# Patient Record
Sex: Female | Born: 1935 | Race: White | Hispanic: No | Smoking: Never smoker
Health system: Southern US, Community
[De-identification: ages and names within clinical notes are randomized; demographics above are authoritative.]

## PROBLEM LIST (undated history)

## (undated) DIAGNOSIS — F09 Unspecified mental disorder due to known physiological condition: Secondary | ICD-10-CM

## (undated) DIAGNOSIS — I10 Essential (primary) hypertension: Secondary | ICD-10-CM

## (undated) DIAGNOSIS — J449 Chronic obstructive pulmonary disease, unspecified: Secondary | ICD-10-CM

## (undated) DIAGNOSIS — E039 Hypothyroidism, unspecified: Secondary | ICD-10-CM

## (undated) DIAGNOSIS — G4700 Insomnia, unspecified: Secondary | ICD-10-CM

## (undated) DIAGNOSIS — R609 Edema, unspecified: Secondary | ICD-10-CM

---

## 2000-02-04 ENCOUNTER — Other Ambulatory Visit: Admission: RE | Admit: 2000-02-04 | Discharge: 2000-02-04 | Payer: Self-pay | Admitting: Family Medicine

## 2000-02-14 ENCOUNTER — Encounter: Payer: Self-pay | Admitting: Family Medicine

## 2000-02-14 ENCOUNTER — Encounter: Admission: RE | Admit: 2000-02-14 | Discharge: 2000-02-14 | Payer: Self-pay | Admitting: Family Medicine

## 2000-12-05 ENCOUNTER — Encounter: Payer: Self-pay | Admitting: Emergency Medicine

## 2000-12-05 ENCOUNTER — Inpatient Hospital Stay (HOSPITAL_COMMUNITY): Admission: EM | Admit: 2000-12-05 | Discharge: 2000-12-06 | Payer: Self-pay | Admitting: Emergency Medicine

## 2001-01-20 ENCOUNTER — Other Ambulatory Visit: Admission: RE | Admit: 2001-01-20 | Discharge: 2001-01-20 | Payer: Self-pay | Admitting: Family Medicine

## 2002-02-11 ENCOUNTER — Other Ambulatory Visit: Admission: RE | Admit: 2002-02-11 | Discharge: 2002-02-11 | Payer: Self-pay | Admitting: Family Medicine

## 2004-01-18 ENCOUNTER — Encounter: Admission: RE | Admit: 2004-01-18 | Discharge: 2004-01-18 | Payer: Self-pay | Admitting: Family Medicine

## 2005-03-14 ENCOUNTER — Other Ambulatory Visit: Admission: RE | Admit: 2005-03-14 | Discharge: 2005-03-14 | Payer: Self-pay | Admitting: Family Medicine

## 2005-08-24 IMAGING — CR DG CHEST 2V
2 series · 2 of 2 positions shown · non-contrast
Comparison: none

CLINICAL DATA: Cough.  
CHEST ? TWO VIEWS
Two views of the chest are compared to a chest x-ray of 02/14/00.  The lungs remain hyperaerated consistent with COPD.  Particularly on the lateral view, there are slightly more prominent markings posteriorly at the lung base which may be medially at the right lung base.  This could represent atelectasis or pneumonia, and a follow-up chest x-ray is recommended.  Mild cardiomegaly is stable.  No acute bony abnormality is seen. 
IMPRESSION
1.  Slightly prominent markings at the lung base posteriorly on the lateral view probably medially at the right lung base, which could represent pneumonia.  Suggest follow-up chest x-ray. 
2.  COPD.

[view not recorded (1 of 2)]
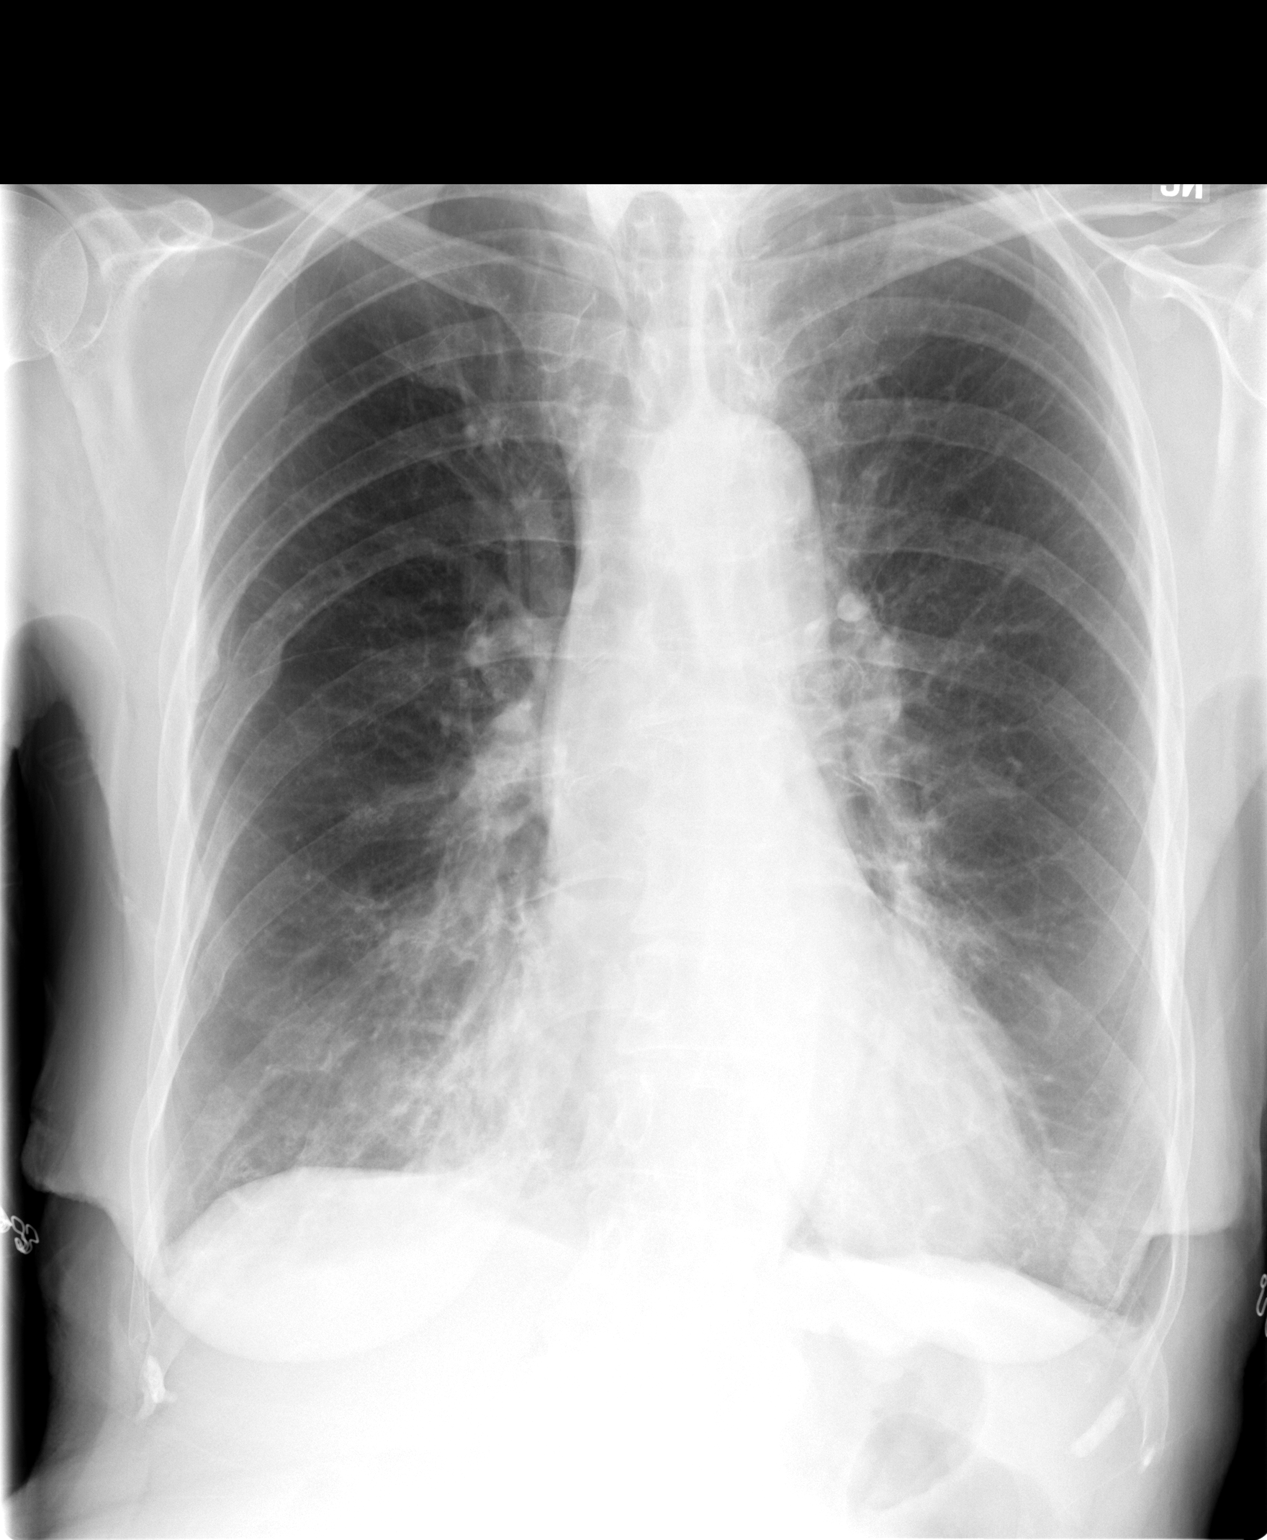

[view not recorded (2 of 2)]
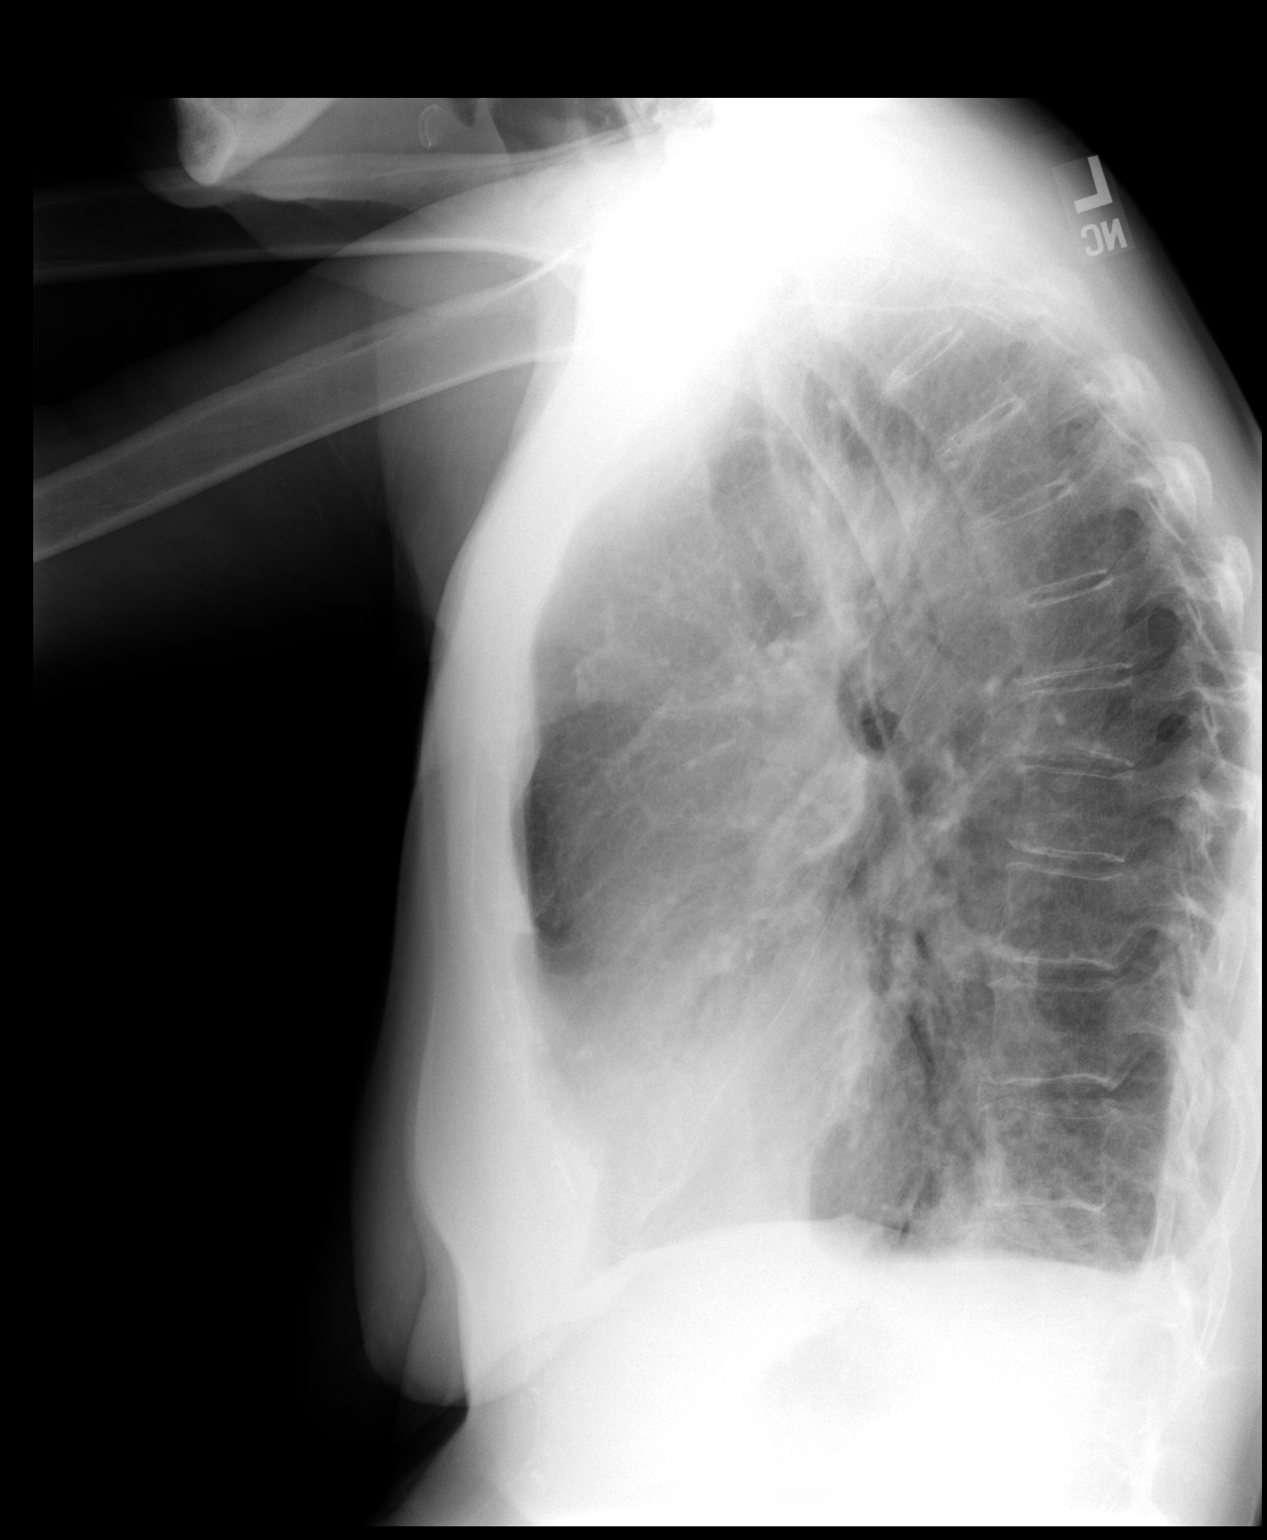

[2 of 2 positions shown; findings below may reference images not displayed]

## 2009-09-23 ENCOUNTER — Emergency Department (HOSPITAL_COMMUNITY): Admission: EM | Admit: 2009-09-23 | Discharge: 2009-09-23 | Payer: Self-pay | Admitting: Emergency Medicine

## 2010-10-08 LAB — CBC
HCT: 36.3 % (ref 36.0–46.0)
Hemoglobin: 11.7 g/dL — ABNORMAL LOW (ref 12.0–15.0)
MCHC: 32.3 g/dL (ref 30.0–36.0)
MCV: 95.6 fL (ref 78.0–100.0)
Platelets: 272 10*3/uL (ref 150–400)
RBC: 3.8 MIL/uL — ABNORMAL LOW (ref 3.87–5.11)
RDW: 14.9 % (ref 11.5–15.5)
WBC: 7.2 10*3/uL (ref 4.0–10.5)

## 2010-10-08 LAB — DIFFERENTIAL
Basophils Absolute: 0.1 10*3/uL (ref 0.0–0.1)
Basophils Relative: 1 % (ref 0–1)
Eosinophils Absolute: 0.2 10*3/uL (ref 0.0–0.7)
Eosinophils Relative: 3 % (ref 0–5)
Lymphocytes Relative: 28 % (ref 12–46)
Lymphs Abs: 2.1 10*3/uL (ref 0.7–4.0)
Monocytes Absolute: 0.3 10*3/uL (ref 0.1–1.0)
Monocytes Relative: 5 % (ref 3–12)
Neutro Abs: 4.6 10*3/uL (ref 1.7–7.7)
Neutrophils Relative %: 63 % (ref 43–77)

## 2010-10-08 LAB — COMPREHENSIVE METABOLIC PANEL
ALT: 30 U/L (ref 0–35)
AST: 23 U/L (ref 0–37)
Albumin: 3.4 g/dL — ABNORMAL LOW (ref 3.5–5.2)
Alkaline Phosphatase: 72 U/L (ref 39–117)
BUN: 54 mg/dL — ABNORMAL HIGH (ref 6–23)
CO2: 29 mEq/L (ref 19–32)
Calcium: 10.1 mg/dL (ref 8.4–10.5)
Chloride: 99 mEq/L (ref 96–112)
Creatinine, Ser: 1.76 mg/dL — ABNORMAL HIGH (ref 0.4–1.2)
GFR calc Af Amer: 34 mL/min — ABNORMAL LOW (ref >60)
GFR calc non Af Amer: 28 mL/min — ABNORMAL LOW (ref >60)
Glucose, Bld: 92 mg/dL (ref 70–99)
Potassium: 5.1 mEq/L (ref 3.5–5.1)
Sodium: 136 mEq/L (ref 135–145)
Total Bilirubin: 0.4 mg/dL (ref 0.3–1.2)
Total Protein: 7.1 g/dL (ref 6.0–8.3)

## 2011-10-11 ENCOUNTER — Inpatient Hospital Stay (HOSPITAL_COMMUNITY)
Admission: EM | Admit: 2011-10-11 | Discharge: 2011-10-18 | DRG: 689 | Disposition: A | Discharge status: Skilled Nursing Facility | Payer: Medicare Other | Attending: Family Medicine | Admitting: Family Medicine

## 2011-10-11 ENCOUNTER — Emergency Department (HOSPITAL_COMMUNITY): Payer: Medicare Other

## 2011-10-11 ENCOUNTER — Encounter (HOSPITAL_COMMUNITY): Payer: Self-pay | Admitting: Physical Medicine and Rehabilitation

## 2011-10-11 DIAGNOSIS — I10 Essential (primary) hypertension: Secondary | ICD-10-CM

## 2011-10-11 DIAGNOSIS — J690 Pneumonitis due to inhalation of food and vomit: Secondary | ICD-10-CM | POA: Diagnosis present

## 2011-10-11 DIAGNOSIS — N390 Urinary tract infection, site not specified: Principal | ICD-10-CM | POA: Diagnosis present

## 2011-10-11 DIAGNOSIS — J4489 Other specified chronic obstructive pulmonary disease: Secondary | ICD-10-CM | POA: Diagnosis present

## 2011-10-11 DIAGNOSIS — F09 Unspecified mental disorder due to known physiological condition: Secondary | ICD-10-CM

## 2011-10-11 DIAGNOSIS — N289 Disorder of kidney and ureter, unspecified: Secondary | ICD-10-CM

## 2011-10-11 DIAGNOSIS — G9340 Encephalopathy, unspecified: Secondary | ICD-10-CM | POA: Diagnosis present

## 2011-10-11 DIAGNOSIS — Z66 Do not resuscitate: Secondary | ICD-10-CM | POA: Diagnosis present

## 2011-10-11 DIAGNOSIS — J8410 Pulmonary fibrosis, unspecified: Secondary | ICD-10-CM | POA: Diagnosis present

## 2011-10-11 DIAGNOSIS — J449 Chronic obstructive pulmonary disease, unspecified: Secondary | ICD-10-CM | POA: Diagnosis present

## 2011-10-11 DIAGNOSIS — A498 Other bacterial infections of unspecified site: Secondary | ICD-10-CM | POA: Diagnosis present

## 2011-10-11 DIAGNOSIS — F0390 Unspecified dementia without behavioral disturbance: Secondary | ICD-10-CM | POA: Diagnosis present

## 2011-10-11 DIAGNOSIS — E039 Hypothyroidism, unspecified: Secondary | ICD-10-CM

## 2011-10-11 DIAGNOSIS — R112 Nausea with vomiting, unspecified: Secondary | ICD-10-CM | POA: Diagnosis present

## 2011-10-11 DIAGNOSIS — J189 Pneumonia, unspecified organism: Secondary | ICD-10-CM | POA: Diagnosis present

## 2011-10-11 DIAGNOSIS — N179 Acute kidney failure, unspecified: Secondary | ICD-10-CM | POA: Diagnosis present

## 2011-10-11 DIAGNOSIS — D72829 Elevated white blood cell count, unspecified: Secondary | ICD-10-CM | POA: Diagnosis present

## 2011-10-11 HISTORY — DX: Insomnia, unspecified: G47.00

## 2011-10-11 HISTORY — DX: Essential (primary) hypertension: I10

## 2011-10-11 HISTORY — DX: Chronic obstructive pulmonary disease, unspecified: J44.9

## 2011-10-11 HISTORY — DX: Unspecified mental disorder due to known physiological condition: F09

## 2011-10-11 HISTORY — DX: Hypothyroidism, unspecified: E03.9

## 2011-10-11 HISTORY — DX: Edema, unspecified: R60.9

## 2011-10-11 LAB — CBC
HCT: 40.7 % (ref 36.0–46.0)
Hemoglobin: 13.8 g/dL (ref 12.0–15.0)
MCH: 31.9 pg (ref 26.0–34.0)
MCHC: 33.9 g/dL (ref 30.0–36.0)
RDW: 13.8 % (ref 11.5–15.5)

## 2011-10-11 LAB — DIFFERENTIAL
Basophils Absolute: 0 10*3/uL (ref 0.0–0.1)
Basophils Relative: 0 % (ref 0–1)
Eosinophils Absolute: 0 10*3/uL (ref 0.0–0.7)
Lymphocytes Relative: 10 % — ABNORMAL LOW (ref 12–46)
Monocytes Absolute: 1.5 10*3/uL — ABNORMAL HIGH (ref 0.1–1.0)
Neutro Abs: 13.3 10*3/uL — ABNORMAL HIGH (ref 1.7–7.7)

## 2011-10-11 LAB — URINALYSIS, ROUTINE W REFLEX MICROSCOPIC
Bilirubin Urine: NEGATIVE
Glucose, UA: NEGATIVE mg/dL
Ketones, ur: NEGATIVE mg/dL
Protein, ur: NEGATIVE mg/dL
Urobilinogen, UA: 1 mg/dL (ref 0.0–1.0)

## 2011-10-11 LAB — LACTIC ACID, PLASMA: Lactic Acid, Venous: 1.6 mmol/L (ref 0.5–2.2)

## 2011-10-11 LAB — POCT I-STAT TROPONIN I

## 2011-10-11 LAB — POCT I-STAT, CHEM 8
Creatinine, Ser: 1.6 mg/dL — ABNORMAL HIGH (ref 0.50–1.10)
HCT: 38 % (ref 36.0–46.0)
Hemoglobin: 12.9 g/dL (ref 12.0–15.0)
Sodium: 142 mEq/L (ref 135–145)
TCO2: 29 mmol/L (ref 0–100)

## 2011-10-11 LAB — URINE MICROSCOPIC-ADD ON

## 2011-10-11 MED ORDER — SODIUM CHLORIDE 0.9 % IV BOLUS (SEPSIS)
1000.0000 mL | Freq: Once | INTRAVENOUS | Status: AC
  Administered 2011-10-11: 1000 mL via INTRAVENOUS

## 2011-10-11 MED ORDER — DEXTROSE 5 % IV SOLN
1.0000 g | Freq: Once | INTRAVENOUS | Status: AC
  Administered 2011-10-12: 1 g via INTRAVENOUS
  Filled 2011-10-11: qty 10

## 2011-10-11 NOTE — ED Notes (Addendum)
Pt presents to department from [facility] for evaluation of N/V. Per staff she had several episodes of vomiting this evening. Upon arrival no active vomiting. Pt is alert, but confused and unable to answer questions correctly. No pain noted at the present. Upper airway congestion and cough noted. Rhonchi heard all lung fields.

## 2011-10-11 NOTE — ED Notes (Signed)
Jo Combs (Daughter) [phone], please call if needed.

## 2011-10-11 NOTE — ED Provider Notes (Addendum)
History     CSN: [id number]  Arrival date & time 10/11/11  2040   First MD Initiated Contact with Patient 10/11/11 2141      Chief Complaint  Patient presents with  . Nausea  . Emesis   Level V caveat due to dementia (Consider location/radiation/quality/duration/timing/severity/associated sxs/prior treatment) Patient is a 78 y.o. female presenting with vomiting. The history is provided by the patient, the nursing home and the EMS personnel.  Emesis    patient reportedly sent in from nursing home for evaluation nausea vomiting. Reportedly began tonight. She's unable to answer my questions or follow commands. This is reportedly at her baseline.  Past Medical History  Diagnosis Date  . Hypertension   . Hypothyroidism   . Organic brain syndrome   . Chronic airway obstruction   . Insomnia   . Heart failure   . Edema     History reviewed. No pertinent past surgical history.  No family history on file.  History  Substance Use Topics  . Smoking status: Never Smoker   . Smokeless tobacco: Not on file  . Alcohol Use: No    OB History    Grav Para Term Preterm Abortions TAB SAB Ect Mult Living                  Review of Systems  Unable to perform ROS: Dementia  Gastrointestinal: Positive for vomiting.    Allergies  Review of patient's allergies indicates no known allergies.  Home Medications   Current Outpatient Rx  Name Route Sig Dispense Refill  . ACETAMINOPHEN 500 MG PO TABS Oral Take 1,000 mg by mouth every 8 (eight) hours as needed. For pain    . CALCIUM CARBONATE-VITAMIN D 500-200 MG-UNIT PO TABS Oral Take 1 tablet by mouth daily.    Marland Kitchen DOCUSATE SODIUM 50 MG/5ML PO LIQD Oral Take 50 mg by mouth daily.    . FUROSEMIDE 20 MG PO TABS Oral Take 10 mg by mouth daily.    . GUAIFENESIN 100 MG/5ML PO SYRP Oral Take 300 mg by mouth every 6 (six) hours as needed. For cough and congestion    . HYDROCERIN EX CREA Topical Apply 1 application topically every evening.       Marland Kitchen HYDROCORTISONE 1 % EX CREA Topical Apply 1 application topically 2 (two) times daily as needed. For itching and burning    . LEVOTHYROXINE SODIUM 50 MCG PO TABS Oral Take 50 mcg by mouth daily.    Marland Kitchen MAGNESIUM HYDROXIDE 400 MG/5ML PO SUSP Oral Take 30 mLs by mouth daily as needed. For constipation    . MICONAZOLE NITRATE 2 % EX CREA Topical Apply 1 application topically 3 (three) times daily as needed. To buttocks for irritation    . MINERAL OIL LIGHT OIL Otic Place 2 drops in ear(s) once a week.    Marland Kitchen RENA-VITE PO TABS Oral Take 1 tablet by mouth daily.    Marland Kitchen POLYVINYL ALCOHOL 1.4 % OP SOLN Both Eyes Place 2 drops into both eyes every 4 (four) hours as needed. For dry eyes    . PRESCRIPTION MEDICATION Topical Apply 1 application topically 2 (two) times daily. Nystatin 100,000 units/1 gm Powder.   To breast area      BP 131/58  Pulse 87  Temp(Src) 98.1 F (36.7 C) (Rectal)  Resp 20  SpO2 94%  Physical Exam  Nursing note and vitals reviewed. Constitutional: She appears well-developed and well-nourished.  HENT:  Head: Normocephalic and atraumatic.  Eyes:  Pupils are reactive  Neck: Normal range of motion. Neck supple.  Cardiovascular: Normal rate, regular rhythm and normal heart sounds.   No murmur heard. Pulmonary/Chest: Effort normal. No respiratory distress. She has no wheezes.       Harsh breath sounds with some transmitted upper airway sounds  Abdominal: Soft. Bowel sounds are normal. She exhibits no distension. There is no tenderness. There is no rebound and no guarding.  Neurological:       Patient is nonverbal forming and we'll not follow commands. She will hold her eyes shut  Skin: Skin is warm and dry.  Psychiatric: Her speech is normal.    ED Course  Procedures (including critical care time)  Labs Reviewed  CBC - Abnormal; Notable for the following:    WBC 16.5 (*)    All other components within normal limits  DIFFERENTIAL - Abnormal; Notable for the  following:    Neutrophils Relative 81 (*)    Lymphocytes Relative 10 (*)    Neutro Abs 13.3 (*)    Monocytes Absolute 1.5 (*)    All other components within normal limits  URINALYSIS, ROUTINE W REFLEX MICROSCOPIC - Abnormal; Notable for the following:    Color, Urine AMBER (*) BIOCHEMICALS MAY BE AFFECTED BY COLOR   APPearance CLOUDY (*)    Hgb urine dipstick SMALL (*)    Nitrite POSITIVE (*)    Leukocytes, UA SMALL (*)    All other components within normal limits  POCT I-STAT, CHEM 8 - Abnormal; Notable for the following:    BUN 34 (*)    Creatinine, Ser 1.60 (*)    Glucose, Bld 127 (*)    All other components within normal limits  URINE MICROSCOPIC-ADD ON - Abnormal; Notable for the following:    Squamous Epithelial / LPF MANY (*)    Bacteria, UA MANY (*)    Casts HYALINE CASTS (*)    All other components within normal limits  LACTIC ACID, PLASMA  POCT I-STAT TROPONIN I  URINE CULTURE  CULTURE, BLOOD (ROUTINE X 2)  CULTURE, BLOOD (ROUTINE X 2)   Dg Chest Port 1 View  10/11/2011  *RADIOLOGY REPORT*  Clinical Data: Vomiting and shortness of breath.  Unresponsive patient.  PORTABLE CHEST - 1 VIEW  Comparison: None.  Findings: Borderline heart size and pulmonary vascularity. Emphysematous changes in the lungs with scattered fibrosis.  No focal airspace consolidation.  No blunting of costophrenic angles. No obvious pneumothorax although portions of the lung apices are excluded from the field of view.  Old right rib fractures demonstrated healing.  Calcified and tortuous aorta.  IMPRESSION: Emphysematous changes and scattered fibrosis in the lungs.  No evidence of active pulmonary disease.  Original Report Authenticated By: Marlon Pel, M.D.     1. UTI (urinary tract infection)   2. Renal insufficiency       MDM  Patient reportedly sent from the nursing home with nausea vomiting. She had an episode of hypotension here. She has renal insufficiency and elevated white count.  She's an apparent urinary tract infection. She'll be admitted to medicine    Date: 10/11/2011  Rate: 71  Rhythm: normal sinus rhythm  QRS Axis: normal  Intervals: PR prolonged  ST/T Wave abnormalities: nonspecific ST/T changes  Conduction Disutrbances:none  Narrative Interpretation:   Old EKG Reviewed: none available       Juliet Rude. Rubin Payor, MD 10/11/11 2346  Juliet Rude. Rubin Payor, MD 10/11/11 [phone]

## 2011-10-11 NOTE — ED Notes (Signed)
Lab at the bedside attempting to draw cultures.

## 2011-10-11 NOTE — ED Notes (Signed)
Pt presents to department from [facility] via [facility] for evaluation of N/V. Onset tonight. History of dementia. Denies pain. She is alert per normal neuro baseline per facility staff, unable to respond and answer questions.

## 2011-10-12 ENCOUNTER — Encounter (HOSPITAL_COMMUNITY): Payer: Self-pay | Admitting: Internal Medicine

## 2011-10-12 DIAGNOSIS — E039 Hypothyroidism, unspecified: Secondary | ICD-10-CM | POA: Insufficient documentation

## 2011-10-12 DIAGNOSIS — I10 Essential (primary) hypertension: Secondary | ICD-10-CM | POA: Insufficient documentation

## 2011-10-12 DIAGNOSIS — N289 Disorder of kidney and ureter, unspecified: Secondary | ICD-10-CM | POA: Clinically undetermined

## 2011-10-12 DIAGNOSIS — N390 Urinary tract infection, site not specified: Secondary | ICD-10-CM | POA: Diagnosis present

## 2011-10-12 DIAGNOSIS — G9340 Encephalopathy, unspecified: Secondary | ICD-10-CM | POA: Diagnosis present

## 2011-10-12 DIAGNOSIS — D72829 Elevated white blood cell count, unspecified: Secondary | ICD-10-CM | POA: Diagnosis present

## 2011-10-12 DIAGNOSIS — R112 Nausea with vomiting, unspecified: Secondary | ICD-10-CM | POA: Diagnosis present

## 2011-10-12 LAB — COMPREHENSIVE METABOLIC PANEL WITH GFR
ALT: 14 U/L (ref 0–35)
AST: 22 U/L (ref 0–37)
Albumin: 2.6 g/dL — ABNORMAL LOW (ref 3.5–5.2)
Alkaline Phosphatase: 100 U/L (ref 39–117)
BUN: 35 mg/dL — ABNORMAL HIGH (ref 6–23)
CO2: 29 meq/L (ref 19–32)
Calcium: 9.7 mg/dL (ref 8.4–10.5)
Chloride: 106 meq/L (ref 96–112)
Creatinine, Ser: 1.47 mg/dL — ABNORMAL HIGH (ref 0.50–1.10)
GFR calc Af Amer: 37 mL/min — ABNORMAL LOW
GFR calc non Af Amer: 32 mL/min — ABNORMAL LOW
Glucose, Bld: 85 mg/dL (ref 70–99)
Potassium: 3.9 meq/L (ref 3.5–5.1)
Sodium: 144 meq/L (ref 135–145)
Total Bilirubin: 0.4 mg/dL (ref 0.3–1.2)
Total Protein: 6.3 g/dL (ref 6.0–8.3)

## 2011-10-12 LAB — CBC
Hemoglobin: 11.9 g/dL — ABNORMAL LOW (ref 12.0–15.0)
MCH: 31.6 pg (ref 26.0–34.0)
Platelets: 153 10*3/uL (ref 150–400)
RBC: 3.77 MIL/uL — ABNORMAL LOW (ref 3.87–5.11)
WBC: 10.8 10*3/uL — ABNORMAL HIGH (ref 4.0–10.5)

## 2011-10-12 LAB — MRSA PCR SCREENING: MRSA by PCR: NEGATIVE

## 2011-10-12 MED ORDER — ALBUTEROL SULFATE (5 MG/ML) 0.5% IN NEBU
2.5000 mg | INHALATION_SOLUTION | Freq: Four times a day (QID) | RESPIRATORY_TRACT | Status: DC
  Administered 2011-10-12 (×4): 2.5 mg via RESPIRATORY_TRACT
  Filled 2011-10-12 (×5): qty 0.5

## 2011-10-12 MED ORDER — IPRATROPIUM BROMIDE 0.02 % IN SOLN
0.5000 mg | Freq: Four times a day (QID) | RESPIRATORY_TRACT | Status: DC
  Administered 2011-10-12 (×4): 0.5 mg via RESPIRATORY_TRACT
  Filled 2011-10-12 (×5): qty 2.5

## 2011-10-12 MED ORDER — ALBUTEROL SULFATE (5 MG/ML) 0.5% IN NEBU: 2.5000 mg | INHALATION_SOLUTION | RESPIRATORY_TRACT | Status: DC | PRN

## 2011-10-12 MED ORDER — HEPARIN SODIUM (PORCINE) 5000 UNIT/ML IJ SOLN
5000.0000 [IU] | Freq: Three times a day (TID) | INTRAMUSCULAR | Status: DC
  Administered 2011-10-12 – 2011-10-18 (×15): 5000 [IU] via SUBCUTANEOUS
  Filled 2011-10-12 (×22): qty 1

## 2011-10-12 MED ORDER — DEXTROSE 50 % IV SOLN
INTRAVENOUS | Status: AC
  Administered 2011-10-12: 50 mL
  Filled 2011-10-12: qty 50

## 2011-10-12 MED ORDER — ACETAMINOPHEN 650 MG RE SUPP: 650.0000 mg | Freq: Four times a day (QID) | RECTAL | Status: DC | PRN

## 2011-10-12 MED ORDER — DEXTROSE 5 % IV SOLN
1.0000 g | INTRAVENOUS | Status: DC
  Administered 2011-10-12 – 2011-10-14 (×3): 1 g via INTRAVENOUS
  Filled 2011-10-12 (×4): qty 10

## 2011-10-12 MED ORDER — DEXTROSE-NACL 5-0.45 % IV SOLN
INTRAVENOUS | Status: DC
  Administered 2011-10-14: 1000 mL via INTRAVENOUS

## 2011-10-12 MED ORDER — ACETAMINOPHEN 325 MG PO TABS: 650.0000 mg | ORAL_TABLET | Freq: Four times a day (QID) | ORAL | Status: DC | PRN

## 2011-10-12 MED ORDER — SODIUM CHLORIDE 0.9 % IV SOLN
INTRAVENOUS | Status: DC
  Administered 2011-10-12: 03:00:00 via INTRAVENOUS

## 2011-10-12 NOTE — Progress Notes (Signed)
0200 Pt arrived on floor alert but nonverbal and unable to follow commands or answer questions.  Pt has redness to her sacrum and between her buttocks, nose and cheeks.  Pt is wearing a bracelet and her own gown.  Pt placed on 2L O2 .  Will continue to monitor. Dahlia Byes Boschen

## 2011-10-12 NOTE — Progress Notes (Signed)
Received reorder for Physical Therapy, however, no changes noted since my note this morning at 9:01.  Do not feel PT is indicated for this patient, will sign off. 10/12/2011 Olivia Canter, PT [phone]

## 2011-10-12 NOTE — Progress Notes (Signed)
Clinical Social Work Department BRIEF PSYCHOSOCIAL ASSESSMENT 10/12/2011  Patient:  Jo Combs, Jo Combs     Account Number:  [id number]     Admit date:  10/11/2011  Clinical Social Worker:  Noel Journey, CLINICAL SOCIAL WORKER  Date/Time:  10/12/2011 10:00 AM  Referred by:  RN  Date Referred:  10/12/2011 Referred for  SNF Placement   Other Referral:   Interview type:  Family Other interview type:    PSYCHOSOCIAL DATA Living Status:  FACILITY Admitted from facility:  [facility] Level of care:  Assisted Living Primary support name:  Jo Combs Primary support relationship to patient:  CHILD, ADULT Degree of support available:   strong    CURRENT CONCERNS Current Concerns  Post-Acute Placement   Other Concerns:    SOCIAL WORK ASSESSMENT / PLAN Pt is from [facility].  CSW confirmed with DTR ok to return.  [facility] unable to confirm on the weekend if ok to return. Per PT eval, Pt at baseline.   Assessment/plan status:  Psychosocial Support/Ongoing Assessment of Needs Other assessment/ plan:   Information/referral to community resources:    PATIENT'S/FAMILY'S RESPONSE TO PLAN OF CARE: Pt dtr verbalized appreciation of CSW call and concern.

## 2011-10-12 NOTE — Progress Notes (Signed)
Received order for Physical Therapy.  Spoke with caregivers at [facility] and patient was dependent in all mobility prior to admission.  Considering patient's limited mobility prior to admission and patient's inability to follow commands, do not feel PT is indicated at this time.  If something changes, please reconsult.  I do recommend that nursing assist patient out of bed every day, utilizing lift.  Thank you, 10/12/2011 Olivia Canter, PT [phone]

## 2011-10-12 NOTE — H&P (Addendum)
!!!! Pt is listed under name Jo Combs, however pt's name is Jo Combs, so suspect pt is admitted under wrong name (should be Jo Combs) !!!! I spoke with nursing to talk to registration about this.   PCP:  Jo Jenny, MD, MD  Confirmed per NH paperwork.   Chief Complaint:  Vomiting  HPI: 84yoF with h/o dementia, resident of [facility], HTN, organic brain syndrome presents with  nausea, vomiting, found to have UTI, leukocytosis, renal insufficiency (unclear acute or chronic).   We have no prior records of her medical history. Pt is unable to give history but apparently was  at baseline mental status per facility. I spoke with the daughter, who states that she hasn't  spoken in a couple years, doesn't talk at all, she doesn't make eye contact or follow commands,  doesn't answer questions, doesn't respond with her eyes. However, she usually is at least alert  enough to look around. NH called the daughter earlier tonight stating that she had been vomiting  earlier today and very weak.    In the ED, vitals were stable. Labs with renal 34/1.6. Trop negative, lactate 1.6. WBC 16.5 with  >20% bands, rest of CBC normal. UA with positive nitrite, small LE, 3-6 WBC's, many bacteria, but  many squamous cells. BCx x2 and UCx pending. CXR with emphysema and scattered fibrosis, but  nothing active. Pt was given 1L of NS and cefriaxone.   ROS otherwise not obtainable.    Past Medical History  Diagnosis Date  . Hypertension   . Hypothyroidism   . Organic brain syndrome   . Chronic airway obstruction     Emphysema and fibrosis on CXR  . Insomnia   . Heart failure   . Edema     History reviewed. No pertinent past surgical history.  Medications:  HOME MEDS: Prior to Admission medications   Medication Sig Start Date End Date Taking? Authorizing Provider  acetaminophen (TYLENOL) 500 MG tablet Take 1,000 mg by mouth every 8 (eight) hours as needed. For pain   Yes Historical  Provider, MD  calcium-vitamin D (OSCAL WITH D) 500-200 MG-UNIT per tablet Take 1 tablet by mouth daily.   Yes Historical Provider, MD  docusate (COLACE) 50 MG/5ML liquid Take 50 mg by mouth daily.   Yes Historical Provider, MD  furosemide (LASIX) 20 MG tablet Take 10 mg by mouth daily.   Yes Historical Provider, MD  guaifenesin (ROBITUSSIN) 100 MG/5ML syrup Take 300 mg by mouth every 6 (six) hours as needed. For cough and congestion   Yes Historical Provider, MD  hydrocerin (EUCERIN) CREA Apply 1 application topically every evening.    Yes Historical Provider, MD  hydrocortisone cream 1 % Apply 1 application topically 2 (two) times daily as needed. For itching and burning   Yes Historical Provider, MD  levothyroxine (SYNTHROID, LEVOTHROID) 50 MCG tablet Take 50 mcg by mouth daily.   Yes Historical Provider, MD  magnesium hydroxide (MILK OF MAGNESIA) 400 MG/5ML suspension Take 30 mLs by mouth daily as needed. For constipation   Yes Historical Provider, MD  miconazole (MICOTIN) 2 % cream Apply 1 application topically 3 (three) times daily as needed. To buttocks for irritation   Yes Historical Provider, MD  mineral oil external liquid Place 2 drops in ear(s) once a week.   Yes Historical Provider, MD  multivitamin (RENA-VIT) TABS tablet Take 1 tablet by mouth daily.   Yes Historical Provider, MD  polyvinyl alcohol (LIQUIFILM TEARS) 1.4 % ophthalmic solution Place  2 drops into both eyes every 4 (four) hours as needed. For dry eyes   Yes Historical Provider, MD  PRESCRIPTION MEDICATION Apply 1 application topically 2 (two) times daily. Nystatin 100,000 units/1 gm Powder.   To breast area   Yes Historical Provider, MD    Allergies:  No Known Allergies  Social History:   reports that she has never smoked. She does not have any smokeless tobacco history on file. She reports that she does not drink alcohol or use illicit drugs. Jo Combs (Daughter) [phone]. DNR paperwork done.   Family  History: No family history on file.  Physical Exam: Filed Vitals:   10/11/11 2051 10/11/11 2323 10/11/11 2354  BP: 131/58  114/58  Pulse: 87  74  Temp: 98.5 F (36.9 C) 98.1 F (36.7 C)   TempSrc: Oral Rectal   Resp: 20  18  SpO2: 94%  94%   Blood pressure 114/58, pulse 74, temperature 98.1 F (36.7 C), temperature source Rectal, resp. rate 18, SpO2 94.00%. Gen: Elderly, frail appearing, completely obtunded F. Her breathing is unlabored but she has very  gurgly, rhonchorous upper airways sounds. She doesn't respond to vigorous tactile stimulation, but  does resist forced eye opening, and grimacing and bites down when I try to suction her.  HEENT: Cannot assess, as I cannot pry her eye open. Mouth unable to assess, bc she bites down when  I try to open it.  Lungs: Expiratory with very diffuse wheezing and gurgly upper airway sounds, otherwise very  difficult exam Heart: S1/2 very hard to hear from upper airway sounds Abd: Soft, non distended, no specific facial grimacing to palpation Extrem: Warm, perfusing normally without evidence of cyanosis or clubbing, no BLE edema noted.  Neuro: Obtunded, not responsive to tactile stimulation, unable to assess exam. She  withdraws/localizes appropriately to pain.    Labs & Imaging Results for orders placed during the hospital encounter of 10/11/11 (from the past 48 hour(s))  CBC     Status: Abnormal   Collection Time   10/11/11 10:05 PM      Component Value Range Comment   WBC 16.5 (*) 4.0 - 10.5 (K/uL)    RBC 4.33  3.87 - 5.11 (MIL/uL)    Hemoglobin 13.8  12.0 - 15.0 (g/dL)    HCT 09.8  11.9 - 14.7 (%)    MCV 94.0  78.0 - 100.0 (fL)    MCH 31.9  26.0 - 34.0 (pg)    MCHC 33.9  30.0 - 36.0 (g/dL)    RDW 82.9  56.2 - 13.0 (%)    Platelets 184  150 - 400 (K/uL)   DIFFERENTIAL     Status: Abnormal   Collection Time   10/11/11 10:05 PM      Component Value Range Comment   Neutrophils Relative 81 (*) 43 - 77 (%)    Lymphocytes Relative  10 (*) 12 - 46 (%)    Monocytes Relative 9  3 - 12 (%)    Eosinophils Relative 0  0 - 5 (%)    Basophils Relative 0  0 - 1 (%)    Neutro Abs 13.3 (*) 1.7 - 7.7 (K/uL)    Lymphs Abs 1.7  0.7 - 4.0 (K/uL)    Monocytes Absolute 1.5 (*) 0.1 - 1.0 (K/uL)    Eosinophils Absolute 0.0  0.0 - 0.7 (K/uL)    Basophils Absolute 0.0  0.0 - 0.1 (K/uL)    WBC Morphology INCREASED BANDS (>20% BANDS)  LACTIC ACID, PLASMA     Status: Normal   Collection Time   10/11/11 10:05 PM      Component Value Range Comment   Lactic Acid, Venous 1.6  0.5 - 2.2 (mmol/L)   POCT I-STAT TROPONIN I     Status: Normal   Collection Time   10/11/11 11:02 PM      Component Value Range Comment   Troponin i, poc 0.02  0.00 - 0.08 (ng/mL)    Comment 3            POCT I-STAT, CHEM 8     Status: Abnormal   Collection Time   10/11/11 11:03 PM      Component Value Range Comment   Sodium 142  135 - 145 (mEq/L)    Potassium 3.9  3.5 - 5.1 (mEq/L)    Chloride 105  96 - 112 (mEq/L)    BUN 34 (*) 6 - 23 (mg/dL)    Creatinine, Ser 1.61 (*) 0.50 - 1.10 (mg/dL)    Glucose, Bld 096 (*) 70 - 99 (mg/dL)    Calcium, Ion 0.45  1.12 - 1.32 (mmol/L)    TCO2 29  0 - 100 (mmol/L)    Hemoglobin 12.9  12.0 - 15.0 (g/dL)    HCT 40.9  81.1 - 91.4 (%)   URINALYSIS, ROUTINE W REFLEX MICROSCOPIC     Status: Abnormal   Collection Time   10/11/11 11:23 PM      Component Value Range Comment   Color, Urine AMBER (*) YELLOW  BIOCHEMICALS MAY BE AFFECTED BY COLOR   APPearance CLOUDY (*) CLEAR     Specific Gravity, Urine 1.020  1.005 - 1.030     pH 5.5  5.0 - 8.0     Glucose, UA NEGATIVE  NEGATIVE (mg/dL)    Hgb urine dipstick SMALL (*) NEGATIVE     Bilirubin Urine NEGATIVE  NEGATIVE     Ketones, ur NEGATIVE  NEGATIVE (mg/dL)    Protein, ur NEGATIVE  NEGATIVE (mg/dL)    Urobilinogen, UA 1.0  0.0 - 1.0 (mg/dL)    Nitrite POSITIVE (*) NEGATIVE     Leukocytes, UA SMALL (*) NEGATIVE    URINE MICROSCOPIC-ADD ON     Status: Abnormal   Collection  Time   10/11/11 11:23 PM      Component Value Range Comment   Squamous Epithelial / LPF MANY (*) RARE     WBC, UA 3-6  <3 (WBC/hpf)    RBC / HPF 3-6  <3 (RBC/hpf)    Bacteria, UA MANY (*) RARE     Casts HYALINE CASTS (*) NEGATIVE     Urine-Other MUCOUS PRESENT      Dg Chest Port 1 View  10/11/2011  *RADIOLOGY REPORT*  Clinical Data: Vomiting and shortness of breath.  Unresponsive patient.  PORTABLE CHEST - 1 VIEW  Comparison: None.  Findings: Borderline heart size and pulmonary vascularity. Emphysematous changes in the lungs with scattered fibrosis.  No focal airspace consolidation.  No blunting of costophrenic angles. No obvious pneumothorax although portions of the lung apices are excluded from the field of view.  Old right rib fractures demonstrated healing.  Calcified and tortuous aorta.  IMPRESSION: Emphysematous changes and scattered fibrosis in the lungs.  No evidence of active pulmonary disease.  Original Report Authenticated By: Marlon Pel, M.D.    Impression Present on Admission:  .UTI (lower urinary tract infection) .Leukocytosis .Nausea and vomiting .Encephalopathy  84yoF with h/o dementia, resident of [facility], HTN,  organic brain syndrome presents with  nausea, vomiting, found to have UTI, leukocytosis, renal insufficiency (unclear acute or chronic).   1. Leukocytosis/bandemia, UTI: Pt with stable hemodynamics and negative lactate which are  reassuring. No prior urine culture data to guide management. - F/u urine culture, will give ceftriaxone empirically. Maintenance IVF's overnight.   2. Encephalopathy: Pt appears quite obtunded, however her baseline is nonverbal, not following  commands, not making eye contact, but still moderately alert, which she isn't at present. Per  discussion with daughter, I offered the options of CT scan of her head now (which would mainly be  for Dx but unlikely to drastically change management, given limited quality of life and  per  daughter "vegetable" mental status) or treat for UTI as above and see if encephalopathy improves.  She elected for watchful waiting.  - Keep NPO, bed rest, neuro checks   3. Renal insufficiency: No prior Cr to compare to, however BUN/Cr ratio does suggest prerenal  etiology. Will get urine lytes, give IVF's overnight, and trend.   4. Nausea, vomiting: Suspect this is due to the UTI. I also suspect that she has aspirated given  her upper airway gurgling noises. Will nasally suction her.   Holding all home meds, keeping NPO.   SubQ heparin Regular bed, [facility] team 9 DNR. Paperwork available, confirmed with daughter. Daughter to be back tomorrow afternoon.   Other plans as per orders.  Tanishia Lemaster 10/12/2011, 1:06 AM

## 2011-10-12 NOTE — Progress Notes (Signed)
RT nasotracheal suctioned patient to obtain sputum sample.  Patient tolerated well throughout procedure.  Sputum sample was sent to lab.

## 2011-10-12 NOTE — Progress Notes (Signed)
Patient seen and examined, admitted by Dr. Kaylyn Layer today morning. Briefly 78 year old female with history of dementia, organic brain syndrome presented for nausea vomiting, UTI, leukocytosis with renal insufficiency. Agree with current admission note and plan by Dr. Kaylyn Layer. Will place on D5 half normal as patient is not eating well, continue IV antibiotics for UTI, follow blood cultures and urine cultures., PT Hall Busing M.D. [facility] 10/12/2011, 11:46 AM  Pager: [phone]

## 2011-10-13 LAB — BASIC METABOLIC PANEL
CO2: 24 mEq/L (ref 19–32)
Calcium: 8.9 mg/dL (ref 8.4–10.5)
Glucose, Bld: 133 mg/dL — ABNORMAL HIGH (ref 70–99)
Sodium: 142 mEq/L (ref 135–145)

## 2011-10-13 LAB — TSH: TSH: 1.755 u[IU]/mL (ref 0.350–4.500)

## 2011-10-13 LAB — CBC
Hemoglobin: 11.4 g/dL — ABNORMAL LOW (ref 12.0–15.0)
MCH: 31.8 pg (ref 26.0–34.0)
RBC: 3.59 MIL/uL — ABNORMAL LOW (ref 3.87–5.11)

## 2011-10-13 MED ORDER — BUDESONIDE-FORMOTEROL FUMARATE 80-4.5 MCG/ACT IN AERO
2.0000 | INHALATION_SPRAY | Freq: Two times a day (BID) | RESPIRATORY_TRACT | Status: DC
  Administered 2011-10-17: 2 via RESPIRATORY_TRACT
  Filled 2011-10-13: qty 6.9

## 2011-10-13 MED ORDER — DOCUSATE SODIUM 50 MG/5ML PO LIQD: 50.0000 mg | Freq: Every day | ORAL | Status: DC

## 2011-10-13 MED ORDER — DEXTROSE 5 % IV SOLN
500.0000 mg | INTRAVENOUS | Status: DC
  Administered 2011-10-13 – 2011-10-14 (×2): 500 mg via INTRAVENOUS
  Filled 2011-10-13 (×3): qty 500

## 2011-10-13 MED ORDER — LEVOTHYROXINE SODIUM 50 MCG PO TABS
50.0000 ug | ORAL_TABLET | Freq: Every day | ORAL | Status: DC
  Administered 2011-10-13 – 2011-10-18 (×6): 50 ug via ORAL
  Filled 2011-10-13 (×8): qty 1

## 2011-10-13 MED ORDER — MAGNESIUM HYDROXIDE 400 MG/5ML PO SUSP: 30.0000 mL | Freq: Every day | ORAL | Status: DC | PRN

## 2011-10-13 MED ORDER — DOCUSATE SODIUM 100 MG PO CAPS
100.0000 mg | ORAL_CAPSULE | Freq: Every day | ORAL | Status: DC
  Administered 2011-10-14 – 2011-10-18 (×5): 100 mg via ORAL
  Filled 2011-10-13 (×5): qty 1

## 2011-10-13 MED ORDER — IPRATROPIUM BROMIDE 0.02 % IN SOLN
0.5000 mg | Freq: Three times a day (TID) | RESPIRATORY_TRACT | Status: DC
  Administered 2011-10-13 – 2011-10-18 (×12): 0.5 mg via RESPIRATORY_TRACT
  Filled 2011-10-13 (×14): qty 2.5

## 2011-10-13 MED ORDER — RENA-VITE PO TABS
1.0000 | ORAL_TABLET | Freq: Every day | ORAL | Status: DC
  Administered 2011-10-13 – 2011-10-18 (×6): 1 via ORAL
  Filled 2011-10-13 (×6): qty 1

## 2011-10-13 MED ORDER — ALBUTEROL SULFATE (5 MG/ML) 0.5% IN NEBU
2.5000 mg | INHALATION_SOLUTION | Freq: Three times a day (TID) | RESPIRATORY_TRACT | Status: DC
  Administered 2011-10-13 – 2011-10-14 (×4): 2.5 mg via RESPIRATORY_TRACT
  Filled 2011-10-13 (×5): qty 0.5

## 2011-10-13 MED ORDER — POLYVINYL ALCOHOL 1.4 % OP SOLN
2.0000 [drp] | OPHTHALMIC | Status: DC | PRN
  Administered 2011-10-13: 2 [drp] via OPHTHALMIC
  Filled 2011-10-13: qty 15

## 2011-10-13 MED ORDER — IPRATROPIUM BROMIDE 0.02 % IN SOLN: 0.5000 mg | RESPIRATORY_TRACT | Status: DC | PRN

## 2011-10-13 MED ORDER — ALBUTEROL SULFATE (5 MG/ML) 0.5% IN NEBU
2.5000 mg | INHALATION_SOLUTION | RESPIRATORY_TRACT | Status: DC | PRN
  Administered 2011-10-15: 2.5 mg via RESPIRATORY_TRACT

## 2011-10-13 NOTE — Progress Notes (Signed)
Patient unable to perform symbicort inhaler treatment. RT will continue to monitor.

## 2011-10-13 NOTE — Progress Notes (Addendum)
Patient ID: Jo Combs  female  ZOX:[id number]    DOB: June 11, 1927    DOA: 10/11/2011  PCP: Florentina Jenny, MD, MD  Subjective: Confused and mumbling to herself (per records appears to be at baseline), no active nausea vomiting  Objective: Weight change: 0.318 kg (11.2 oz)  Intake/Output Summary (Last 24 hours) at 10/13/11 1126 Last data filed at 10/13/11 0700  Gross per 24 hour  Intake    660 ml  Output      0 ml  Net    660 ml   Blood pressure 105/53, pulse 100, temperature 99.7 F (37.6 C), temperature source Oral, resp. rate 18, weight 69.446 kg (153 lb 1.6 oz), SpO2 95.00%.  Physical Exam: General: Confused, not in any acute distress. HEENT: anicteric sclera, pupils reactive to light and accommodation, EOMI CVS: S1-S2 clear, no murmur rubs or gallops Chest: Scattered wheezing with bibasilar crackles  Abdomen: soft nontender, nondistended, normal bowel sounds, no organomegaly Extremities: no cyanosis, clubbing or edema noted bilaterally Neuro: Cranial nerves II-XII intact, no focal neurological deficits  Lab Results: Basic Metabolic Panel:  Lab 10/13/11 8119 10/12/11 0700  NA 142 144  K 3.9 3.9  CL 107 106  CO2 24 29  GLUCOSE 133* 85  BUN 26* 35*  CREATININE 1.26* 1.47*  CALCIUM 8.9 9.7  MG -- --  PHOS -- --   Liver Function Tests:  Lab 10/12/11 0700  AST 22  ALT 14  ALKPHOS 100  BILITOT 0.4  PROT 6.3  ALBUMIN 2.6*   CBC:  Lab 10/13/11 0844 10/12/11 0700 10/11/11 2205  WBC 9.5 10.8* --  NEUTROABS -- -- 13.3*  HGB 11.4* 11.9* --  HCT 34.3* 36.0 --  MCV 95.5 95.5 --  PLT 156 153 --   CBG:  Lab 10/13/11 0730 10/12/11 0840 10/12/11 0756  GLUCAP 108* 152* 63*     Micro Results: Recent Results (from the past 240 hour(s))  CULTURE, BLOOD (ROUTINE X 2)     Status: Normal (Preliminary result)   Collection Time   10/11/11 10:05 PM      Component Value Range Status Comment   Specimen Description BLOOD RIGHT HAND   Final    Special Requests BOTTLES DRAWN  AEROBIC AND ANAEROBIC [facility]   Final    Culture  Setup Time [id number]   Final    Culture     Final    Value:        BLOOD CULTURE RECEIVED NO GROWTH TO DATE CULTURE WILL BE HELD FOR 5 DAYS BEFORE ISSUING A FINAL NEGATIVE REPORT   Report Status PENDING   Incomplete   CULTURE, BLOOD (ROUTINE X 2)     Status: Normal (Preliminary result)   Collection Time   10/11/11 10:15 PM      Component Value Range Status Comment   Specimen Description BLOOD LEFT HAND   Final    Special Requests BOTTLES DRAWN AEROBIC ONLY 4CC   Final    Culture  Setup Time [id number]   Final    Culture     Final    Value:        BLOOD CULTURE RECEIVED NO GROWTH TO DATE CULTURE WILL BE HELD FOR 5 DAYS BEFORE ISSUING A FINAL NEGATIVE REPORT   Report Status PENDING   Incomplete   URINE CULTURE     Status: Normal (Preliminary result)   Collection Time   10/11/11 11:23 PM      Component Value Range Status Comment   Specimen Description URINE, CATHETERIZED  Final    Special Requests NONE   Final    Culture  Setup Time [id number]   Final    Colony Count PENDING   Incomplete    Culture Culture reincubated for better growth   Final    Report Status PENDING   Incomplete   MRSA PCR SCREENING     Status: Normal   Collection Time   10/12/11  2:19 AM      Component Value Range Status Comment   MRSA by PCR NEGATIVE  NEGATIVE  Final   CULTURE, RESPIRATORY     Status: Normal (Preliminary result)   Collection Time   10/12/11  8:16 AM      Component Value Range Status Comment   Specimen Description OTHER   Final    Special Requests NT SUCTION   Final    Gram Stain     Final    Value: ABUNDANT WBC PRESENT, PREDOMINANTLY PMN     RARE SQUAMOUS EPITHELIAL CELLS PRESENT     RARE GRAM POSITIVE COCCI IN PAIRS   Culture PENDING   Incomplete    Report Status PENDING   Incomplete     Studies/Results: Dg Chest Port 1 View  10/11/2011  *RADIOLOGY REPORT*  Clinical Data: Vomiting and shortness of breath.  Unresponsive patient.   PORTABLE CHEST - 1 VIEW  Comparison: None.  Findings: Borderline heart size and pulmonary vascularity. Emphysematous changes in the lungs with scattered fibrosis.  No focal airspace consolidation.  No blunting of costophrenic angles. No obvious pneumothorax although portions of the lung apices are excluded from the field of view.  Old right rib fractures demonstrated healing.  Calcified and tortuous aorta.  IMPRESSION: Emphysematous changes and scattered fibrosis in the lungs.  No evidence of active pulmonary disease.  Original Report Authenticated By: Marlon Pel, M.D.    Medications: Scheduled Meds:   . albuterol  2.5 mg Nebulization TID  . azithromycin  500 mg Intravenous Q24H  . budesonide-formoterol  2 puff Inhalation BID  . cefTRIAXone (ROCEPHIN)  IV  1 g Intravenous Q24H  . docusate sodium  100 mg Oral Daily  . heparin  5,000 Units Subcutaneous Q8H  . ipratropium  0.5 mg Nebulization TID  . levothyroxine  50 mcg Oral QAC breakfast  . multivitamin  1 tablet Oral Daily  . DISCONTD: albuterol  2.5 mg Nebulization Q6H  . DISCONTD: docusate  50 mg Oral Daily  . DISCONTD: ipratropium  0.5 mg Nebulization Q6H   Continuous Infusions:   . dextrose 5 % and 0.45% NaCl       Assessment/Plan: Principal Problem:  *UTI (lower urinary tract infection) - Follow cultures, continue Rocephin  Lung fibrosis/COPD/? 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 [facility] 10/13/2011, 11:26 AM Pager: [phone]

## 2011-10-14 LAB — GLUCOSE, CAPILLARY: Glucose-Capillary: 96 mg/dL (ref 70–99)

## 2011-10-14 LAB — BASIC METABOLIC PANEL
CO2: 27 mEq/L (ref 19–32)
Chloride: 103 mEq/L (ref 96–112)
Creatinine, Ser: 1.15 mg/dL — ABNORMAL HIGH (ref 0.50–1.10)
Potassium: 3.8 mEq/L (ref 3.5–5.1)
Sodium: 139 mEq/L (ref 135–145)

## 2011-10-14 LAB — CULTURE, RESPIRATORY W GRAM STAIN

## 2011-10-14 NOTE — Progress Notes (Signed)
  CARE MANAGEMENT NOTE 10/14/2011  Patient:  Jo Combs, Jo Combs   Account Number:  [id number]  Date Initiated:  10/14/2011  Documentation initiated by:  Onnie Boer  Subjective/Objective Assessment:   PT WAS ADMITTED WITH UTI     Action/Plan:   PROGRESSION OF CARE AND DISCHARGE PLANNING   Anticipated DC Date:     Anticipated DC Plan:           Choice offered to / List presented to:             Status of service:  In process, will continue to follow Medicare Important Message given?   (If response is "NO", the following Medicare IM given date fields will be blank) Date Medicare IM given:   Date Additional Medicare IM given:    Discharge Disposition:    Per UR Regulation:  Reviewed for med. necessity/level of care/duration of stay  If discussed at Long Length of Stay Meetings, dates discussed:    Comments:  10/14/11 Onnie Boer, RN, BSN UR COMPLETED [phone]

## 2011-10-14 NOTE — Progress Notes (Signed)
Patient ID: Jo Combs  female  WUJ:[id number]    DOB: 11-19-1926    DOA: 10/11/2011  PCP: Florentina Jenny, MD, MD  Subjective: Confused and mumbling to herself (per records appears to be at baseline)  Objective: Weight change: 0.354 kg (12.5 oz)  Intake/Output Summary (Last 24 hours) at 10/14/11 1135 Last data filed at 10/14/11 0900  Gross per 24 hour  Intake    150 ml  Output      0 ml  Net    150 ml   Blood pressure 115/63, pulse 66, temperature 99 F (37.2 C), temperature source Oral, resp. rate 18, weight 69.8 kg (153 lb 14.1 oz), SpO2 94.00%.  Physical Exam: General: Confused, not in any acute distress. HEENT: anicteric sclera, pupils reactive to light and accommodation, EOMI CVS: S1-S2 clear, no murmur rubs or gallops Chest: Scattered wheezing with bibasilar crackles, somewhat improved from yesterday  Abdomen: soft nontender, nondistended, normal bowel sounds, no organomegaly Extremities: no cyanosis, clubbing or edema noted bilaterally Neuro: Cranial nerves II-XII intact, no focal neurological deficits  Lab Results: Basic Metabolic Panel:  Lab 10/14/11 9562 10/13/11 0844  NA 139 142  K 3.8 3.9  CL 103 107  CO2 27 24  GLUCOSE 96 133*  BUN 19 26*  CREATININE 1.15* 1.26*  CALCIUM 8.6 8.9  MG -- --  PHOS -- --   Liver Function Tests:  Lab 10/12/11 0700  AST 22  ALT 14  ALKPHOS 100  BILITOT 0.4  PROT 6.3  ALBUMIN 2.6*   CBC:  Lab 10/13/11 0844 10/12/11 0700 10/11/11 2205  WBC 9.5 10.8* --  NEUTROABS -- -- 13.3*  HGB 11.4* 11.9* --  HCT 34.3* 36.0 --  MCV 95.5 95.5 --  PLT 156 153 --   CBG:  Lab 10/14/11 0732 10/13/11 0730 10/12/11 0840 10/12/11 0756  GLUCAP 96 108* 152* 63*     Micro Results: Recent Results (from the past 240 hour(s))  CULTURE, BLOOD (ROUTINE X 2)     Status: Normal (Preliminary result)   Collection Time   10/11/11 10:05 PM      Component Value Range Status Comment   Specimen Description BLOOD RIGHT HAND   Final    Special  Requests BOTTLES DRAWN AEROBIC AND ANAEROBIC [facility]   Final    Culture  Setup Time [id number]   Final    Culture     Final    Value:        BLOOD CULTURE RECEIVED NO GROWTH TO DATE CULTURE WILL BE HELD FOR 5 DAYS BEFORE ISSUING A FINAL NEGATIVE REPORT   Report Status PENDING   Incomplete   CULTURE, BLOOD (ROUTINE X 2)     Status: Normal (Preliminary result)   Collection Time   10/11/11 10:15 PM      Component Value Range Status Comment   Specimen Description BLOOD LEFT HAND   Final    Special Requests BOTTLES DRAWN AEROBIC ONLY 4CC   Final    Culture  Setup Time [id number]   Final    Culture     Final    Value:        BLOOD CULTURE RECEIVED NO GROWTH TO DATE CULTURE WILL BE HELD FOR 5 DAYS BEFORE ISSUING A FINAL NEGATIVE REPORT   Report Status PENDING   Incomplete   URINE CULTURE     Status: Normal (Preliminary result)   Collection Time   10/11/11 11:23 PM      Component Value Range Status Comment   Specimen  Description URINE, CATHETERIZED   Final    Special Requests NONE   Final    Culture  Setup Time [id number]   Final    Colony Count >=100,000 COLONIES/ML   Final    Culture ESCHERICHIA COLI   Final    Report Status PENDING   Incomplete   MRSA PCR SCREENING     Status: Normal   Collection Time   10/12/11  2:19 AM      Component Value Range Status Comment   MRSA by PCR NEGATIVE  NEGATIVE  Final   CULTURE, RESPIRATORY     Status: Normal (Preliminary result)   Collection Time   10/12/11  8:16 AM      Component Value Range Status Comment   Specimen Description OTHER   Final    Special Requests NT SUCTION   Final    Gram Stain     Final    Value: ABUNDANT WBC PRESENT, PREDOMINANTLY PMN     RARE SQUAMOUS EPITHELIAL CELLS PRESENT     RARE GRAM POSITIVE COCCI IN PAIRS   Culture Culture reincubated for better growth   Final    Report Status PENDING   Incomplete     Studies/Results: Dg Chest Port 1 View  10/11/2011  *RADIOLOGY REPORT*  Clinical Data: Vomiting and shortness  of breath.  Unresponsive patient.  PORTABLE CHEST - 1 VIEW  Comparison: None.  Findings: Borderline heart size and pulmonary vascularity. Emphysematous changes in the lungs with scattered fibrosis.  No focal airspace consolidation.  No blunting of costophrenic angles. No obvious pneumothorax although portions of the lung apices are excluded from the field of view.  Old right rib fractures demonstrated healing.  Calcified and tortuous aorta.  IMPRESSION: Emphysematous changes and scattered fibrosis in the lungs.  No evidence of active pulmonary disease.  Original Report Authenticated By: Marlon Pel, M.D.    Medications: Scheduled Meds:    . albuterol  2.5 mg Nebulization TID  . azithromycin  500 mg Intravenous Q24H  . budesonide-formoterol  2 puff Inhalation BID  . cefTRIAXone (ROCEPHIN)  IV  1 g Intravenous Q24H  . docusate sodium  100 mg Oral Daily  . heparin  5,000 Units Subcutaneous Q8H  . ipratropium  0.5 mg Nebulization TID  . levothyroxine  50 mcg Oral QAC breakfast  . multivitamin  1 tablet Oral Daily   Continuous Infusions:    . dextrose 5 % and 0.45% NaCl       Assessment/Plan: Principal Problem:  *UTI (lower urinary tract infection): Urine cultures growing Escherichia coli - Follow cultures, continue Rocephi await sensitivities ,   Lung fibrosis/COPD/? Pneumonitis: ? 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    LOS: 3 days   Andra Heslin M.D. [facility] 10/14/2011, 11:35 AM Pager: [phone]

## 2011-10-14 NOTE — Evaluation (Signed)
Clinical/Bedside Swallow Evaluation Patient Details  Name: Jo Combs MRN: [id number] DOB: May 20, 1927 Today's Date: 10/14/2011  Past Medical History:  Past Medical History  Diagnosis Date  . Hypertension   . Hypothyroidism   . Organic brain syndrome   . Chronic airway obstruction     Emphysema and fibrosis on CXR  . Insomnia   . Heart failure   . Edema    Past Surgical History: History reviewed. No pertinent past surgical history. HPI:  Pt is an 78 year old female admitted with UTI, CXR shows emphysema and fibrosis, suspicion of aspiration due to wet respirations. At baseline pt with dementia, does not follow commands.    Assessment/Recommendations/Treatment Plan    SLP Assessment Clinical Impression Statement: Pt is a dependent feeder who presents with mild dementia-related oral dysphagia with intermittent holding of boluses and occasional piecemeal deglutition. Pt was able to masticate solid cracker without difficulty and transited in a timley manner . No overt s/s of aspiraiton observed. Feel pt is safe to attempt upgrade of diet to a mechanical soft texture. SLP will f/u x1 to determine tolerace of solid and liquid consistency. . Risk for Aspiration: Mild Other Related Risk Factors: Cognitive impairment  Swallow Evaluation Recommendations Diet Recommendations: Dysphagia 3 (Mechanical Soft);Thin liquid Liquid Administration via: Cup Medication Administration: Whole meds with puree Supervision: Staff feed patient Compensations: Slow rate;Small sips/bites Postural Changes and/or Swallow Maneuvers: Seated upright 90 degrees Oral Care Recommendations: Oral care BID  Treatment Plan Treatment Plan Recommendations: Therapy as outlined in treatment plan below Speech Therapy Frequency: min 2x/week Treatment Duration: 2 weeks Interventions: Diet toleration management by SLP  Prognosis Prognosis for Safe Diet Advancement: Fair  Swallowing Goals  SLP Swallowing Goals Patient  will consume recommended diet without observed clinical signs of aspiration with: Maximum assistance Patient will utilize recommended strategies during swallow to increase swallowing safety with: Total assistance  Jo Ditty, MA CCC-SLP [phone]  Claudine Mouton 10/14/2011,2:29 PM

## 2011-10-14 NOTE — Progress Notes (Signed)
INITIAL ADULT NUTRITION ASSESSMENT Date: 10/14/2011   Time: 12:03 PM  Reason for Assessment: Low Braden  ASSESSMENT: Female 78 y.o.  Dx: UTI (lower urinary tract infection)  Hx:  Past Medical History  Diagnosis Date  . Hypertension   . Hypothyroidism   . Organic brain syndrome   . Chronic airway obstruction     Emphysema and fibrosis on CXR  . Insomnia   . Heart failure   . Edema    Related Meds:     . albuterol  2.5 mg Nebulization TID  . azithromycin  500 mg Intravenous Q24H  . budesonide-formoterol  2 puff Inhalation BID  . cefTRIAXone (ROCEPHIN)  IV  1 g Intravenous Q24H  . docusate sodium  100 mg Oral Daily  . heparin  5,000 Units Subcutaneous Q8H  . ipratropium  0.5 mg Nebulization TID  . levothyroxine  50 mcg Oral QAC breakfast  . multivitamin  1 tablet Oral Daily   Ht:  5\' 4"  (162.6 cm) estimated by RN  Wt: 153 lb 14.1 oz (69.8 kg)  Ideal Wt:    54.5 kg % Ideal Wt: 128%  Usual Wt: unable to obtain % Usual Wt: n/a  BMI: 26.4; pt is overweight.  Food/Nutrition Related Hx: from nursing home; per nursing home records, receives Might[employer] PO BID  Labs:  [facility]     Component Value Date/Time   NA 139 10/14/2011 0550   K 3.8 10/14/2011 0550   CL 103 10/14/2011 0550   CO2 27 10/14/2011 0550   GLUCOSE 96 10/14/2011 0550   BUN 19 10/14/2011 0550   CREATININE 1.15* 10/14/2011 0550   CALCIUM 8.6 10/14/2011 0550   PROT 6.3 10/12/2011 0700   ALBUMIN 2.6* 10/12/2011 0700   AST 22 10/12/2011 0700   ALT 14 10/12/2011 0700   ALKPHOS 100 10/12/2011 0700   BILITOT 0.4 10/12/2011 0700   GFRNONAA 43* 10/14/2011 0550   GFRAA 50* 10/14/2011 0550   Intake/Output: I/O last 3 completed shifts: In: 530 [P.O.:300; I.V.:180; IV Piggyback:50] Out: -  Total I/O In: 30 [P.O.:30] Out: -   Diet Order: Dysphagia 1 with thin liquids  Supplements/Tube Feeding: none  IVF:    dextrose 5 % and 0.45% NaCl   Estimated Nutritional Needs:   Kcal: 1300 - 1500 kcal Protein:  70 - 80  grams Fluid: 1.8 - 2 L/d  Pt admitted from nursing facility with vomiting. Per H&P, suspected cause of n/v is UTI. Pt with notable hx of organic brain syndrome and dementia. Noted daughter's report that pt doesn't talk, make eye contact, or answer questions at baseline.   Currently on pureed diet, most recent physician requesting swallow evaluation. RN reports pt ate fair for breakfast, consuming 50%. Noted pt on "Might Shake Supplements PO BID" at nursing facility. These supplements provide 200 - 300 kcal and 6 - 9 grams of protein each  NUTRITION DIAGNOSIS: -Inadequate oral intake (NI-2.1).  Status: Ongoing  RELATED TO: variable appetite  AS EVIDENCE BY: intermittent PO intake, use of supplements PTA  MONITORING/EVALUATION(Goals): Goal: Pt to consume >/=  Monitor: PO intake, weights, labs, I/O's  EDUCATION NEEDS: -No education needs identified at this time  INTERVENTION: 1. Offer Magic Cup on trays, RD to arrange for supplements to be delivered on trays 2. RD to continue to follow  Dietitian #: [id number]  DOCUMENTATION CODES Per approved criteria  -Not Applicable   Adair Laundry 10/14/2011, 12:03 PM

## 2011-10-14 NOTE — Clinical Documentation Improvement (Signed)
RENAL FAILURE DOCUMENTATION CLARIFICATION QUERY  THIS DOCUMENT IS NOT A PERMANENT PART OF THE MEDICAL RECORD  TO RESPOND TO THE THIS QUERY, FOLLOW THE INSTRUCTIONS BELOW:  1. If needed, update documentation for the patient's encounter via the notes activity.  2. Access this query again and click edit on the In [employer].  3. After updating, or not, click F2 to complete all highlighted (required) fields concerning your review. Select "additional documentation in the medical record" OR "no additional documentation provided".  4. Click Sign note button.  5. The deficiency will fall out of your In Basket *Please let us know if you are not able to complete this workflow by phone or e-mail (listed below).  Please update your documentation within the medical record to reflect your response to this query.                                                                                    10/14/11  Dear Dr. Isidoro Donning /[facility]  In a better effort to capture your patient's severity of illness, reflect appropriate length of stay and utilization of resources, a review of the patient medical record has revealed the following indicators.    Based on your clinical judgment, please clarify and document in a progress note and/or discharge summary the clinical condition associated with the following supporting information:  In responding to this query please exercise your independent judgment.  The fact that a query is asked, does not imply that any particular answer is desired or expected.  Possible Clinical Conditions?  _______Acute Renal Failure _______Acute Kidney Injury _______Acute Tubular Necrosis _______Acute Renal Cortical Necrosis _______Acute Renal Medullary Necrosis _______Acute on Chronic Renal Failure _______Chronic Renal Failure _______Other Condition_____________ _______Cannot Clinically Determine    Supporting Information:  Risk Factors:(AS PER NOTES) " presented for nausea  vomiting, UTI, leukocytosis with renal insufficiency." "Acute renal insufficiency: Improving, continue IV fluids.  Diagnostics: Lab:  Current Creatinine Level (trend):3-30 = 1.47  Current BUN Level (trend): 3-30 =35   GFR:3-30 = 32  Treatments: " IV  FLUIDS"  You may use possible, probable, or suspect with inpatient documentation. possible, probable, suspected diagnoses MUST be documented at the time of discharge  Reviewed:  no additional documentation provided  Thank You,  Joanette Gula Delk RN,BSN  Clinical Documentation Specialist: [phone] Pager Health Information Management [place]

## 2011-10-15 ENCOUNTER — Inpatient Hospital Stay (HOSPITAL_COMMUNITY): Payer: Medicare Other

## 2011-10-15 LAB — URINE CULTURE
Colony Count: >=100000
Culture  Setup Time: 201303300040

## 2011-10-15 LAB — GLUCOSE, CAPILLARY: Glucose-Capillary: 91 mg/dL (ref 70–99)

## 2011-10-15 LAB — BASIC METABOLIC PANEL
CO2: 29 mEq/L (ref 19–32)
Chloride: 102 mEq/L (ref 96–112)
Potassium: 3.9 mEq/L (ref 3.5–5.1)
Sodium: 138 mEq/L (ref 135–145)

## 2011-10-15 MED ORDER — FUROSEMIDE 10 MG/ML IJ SOLN
40.0000 mg | Freq: Once | INTRAMUSCULAR | Status: AC
  Administered 2011-10-15: 40 mg via INTRAVENOUS
  Filled 2011-10-15: qty 4

## 2011-10-15 MED ORDER — LEVALBUTEROL HCL 0.63 MG/3ML IN NEBU
0.6300 mg | INHALATION_SOLUTION | Freq: Three times a day (TID) | RESPIRATORY_TRACT | Status: DC
  Administered 2011-10-16 – 2011-10-18 (×7): 0.63 mg via RESPIRATORY_TRACT
  Filled 2011-10-15 (×12): qty 3

## 2011-10-15 MED ORDER — LEVOFLOXACIN IN D5W 750 MG/150ML IV SOLN
750.0000 mg | INTRAVENOUS | Status: DC
  Administered 2011-10-15 – 2011-10-17 (×3): 750 mg via INTRAVENOUS
  Filled 2011-10-15 (×3): qty 150

## 2011-10-15 NOTE — Progress Notes (Signed)
Patient ID: Jo Combs  female  ZOX:[id number]    DOB: 1927/04/07    DOA: 10/11/2011  PCP: Florentina Jenny, MD, MD  Subjective: Alert, awake, confused and mumbling, makes eye contact and smiles (closer to baseline, per my long discussion with patient's daughter at the bedside yesterday evening )  - passed a swallow study, started on dysphagia 3 diet, per RN tolerating it with full assistance  Objective: Weight change: 4.182 kg (9 lb 3.5 oz)  Intake/Output Summary (Last 24 hours) at 10/15/11 1229 Last data filed at 10/15/11 0945  Gross per 24 hour  Intake 1029.33 ml  Output      0 ml  Net 1029.33 ml   Blood pressure 115/68, pulse 112, temperature 98.3 F (36.8 C), temperature source Oral, resp. rate 18, weight 73.982 kg (163 lb 1.6 oz), SpO2 98.00%.  Physical Exam: General: Confused, not in any acute distress, makes eye contact and smiles. HEENT: anicteric sclera, pupils reactive to light and accommodation, EOMI CVS: S1-S2 clear, no murmur rubs or gallops Chest:  coarse breath sounds   Abdomen: soft nontender, nondistended, normal bowel sounds, no organomegaly Extremities: no cyanosis, clubbing or edema noted bilaterally  Lab Results: Basic Metabolic Panel:  Lab 10/15/11 8119 10/14/11 0550  NA 138 139  K 3.9 3.8  CL 102 103  CO2 29 27  GLUCOSE 100* 96  BUN 15 19  CREATININE 0.97 1.15*  CALCIUM 8.9 8.6  MG -- --  PHOS -- --   Liver Function Tests:  Lab 10/12/11 0700  AST 22  ALT 14  ALKPHOS 100  BILITOT 0.4  PROT 6.3  ALBUMIN 2.6*   CBC:  Lab 10/13/11 0844 10/12/11 0700 10/11/11 2205  WBC 9.5 10.8* --  NEUTROABS -- -- 13.3*  HGB 11.4* 11.9* --  HCT 34.3* 36.0 --  MCV 95.5 95.5 --  PLT 156 153 --   CBG:  Lab 10/15/11 0746 10/14/11 0732 10/13/11 0730 10/12/11 0840 10/12/11 0756  GLUCAP 91 96 108* 152* 63*     Micro Results: Recent Results (from the past 240 hour(s))  CULTURE, BLOOD (ROUTINE X 2)     Status: Normal (Preliminary result)   Collection Time     10/11/11 10:05 PM      Component Value Range Status Comment   Specimen Description BLOOD RIGHT HAND   Final    Special Requests BOTTLES DRAWN AEROBIC AND ANAEROBIC [facility]   Final    Culture  Setup Time [id number]   Final    Culture     Final    Value:        BLOOD CULTURE RECEIVED NO GROWTH TO DATE CULTURE WILL BE HELD FOR 5 DAYS BEFORE ISSUING A FINAL NEGATIVE REPORT   Report Status PENDING   Incomplete   CULTURE, BLOOD (ROUTINE X 2)     Status: Normal (Preliminary result)   Collection Time   10/11/11 10:15 PM      Component Value Range Status Comment   Specimen Description BLOOD LEFT HAND   Final    Special Requests BOTTLES DRAWN AEROBIC ONLY 4CC   Final    Culture  Setup Time [id number]   Final    Culture     Final    Value:        BLOOD CULTURE RECEIVED NO GROWTH TO DATE CULTURE WILL BE HELD FOR 5 DAYS BEFORE ISSUING A FINAL NEGATIVE REPORT   Report Status PENDING   Incomplete   URINE CULTURE     Status: Normal  Collection Time   10/11/11 11:23 PM      Component Value Range Status Comment   Specimen Description URINE, CATHETERIZED   Final    Special Requests NONE   Final    Culture  Setup Time [id number]   Final    Colony Count >=100,000 COLONIES/ML   Final    Culture     Final    Value: ESCHERICHIA COLI     Note: Two isolates with different morphologies were identified as the same organism.The most resistant organism was reported.   Report Status 10/15/2011 FINAL   Final    Organism ID, Bacteria ESCHERICHIA COLI   Final   MRSA PCR SCREENING     Status: Normal   Collection Time   10/12/11  2:19 AM      Component Value Range Status Comment   MRSA by PCR NEGATIVE  NEGATIVE  Final   CULTURE, RESPIRATORY     Status: Normal   Collection Time   10/12/11  8:16 AM      Component Value Range Status Comment   Specimen Description OTHER   Final    Special Requests NT SUCTION   Final    Gram Stain     Final    Value: ABUNDANT WBC PRESENT, PREDOMINANTLY PMN     RARE SQUAMOUS  EPITHELIAL CELLS PRESENT     RARE GRAM POSITIVE COCCI IN PAIRS   Culture     Final    Value: MODERATE MORAXELLA CATARRHALIS(BRANHAMELLA)     Note: BETA LACTAMASE POSITIVE   Report Status 10/14/2011 FINAL   Final     Studies/Results: Dg Chest Port 1 View  10/11/2011  *RADIOLOGY REPORT*  Clinical Data: Vomiting and shortness of breath.  Unresponsive patient.  PORTABLE CHEST - 1 VIEW  Comparison: None.  Findings: Borderline heart size and pulmonary vascularity. Emphysematous changes in the lungs with scattered fibrosis.  No focal airspace consolidation.  No blunting of costophrenic angles. No obvious pneumothorax although portions of the lung apices are excluded from the field of view.  Old right rib fractures demonstrated healing.  Calcified and tortuous aorta.  IMPRESSION: Emphysematous changes and scattered fibrosis in the lungs.  No evidence of active pulmonary disease.  Original Report Authenticated By: Marlon Pel, M.D.    Medications: Scheduled Meds:    . azithromycin  500 mg Intravenous Q24H  . budesonide-formoterol  2 puff Inhalation BID  . cefTRIAXone (ROCEPHIN)  IV  1 g Intravenous Q24H  . docusate sodium  100 mg Oral Daily  . furosemide  40 mg Intravenous Once  . heparin  5,000 Units Subcutaneous Q8H  . ipratropium  0.5 mg Nebulization TID  . levalbuterol  0.63 mg Nebulization TID  . levothyroxine  50 mcg Oral QAC breakfast  . multivitamin  1 tablet Oral Daily  . DISCONTD: albuterol  2.5 mg Nebulization TID   Continuous Infusions:    . DISCONTD: dextrose 5 % and 0.45% NaCl 40 mL/hr at 10/14/11 1904     Assessment/Plan: Principal Problem:  *UTI (lower urinary tract infection): Urine cultures growing Escherichia coli -  cultures sensitive to Rocephin and Levaquin   Lung fibrosis/COPD/? Pneumonitis: ? Aspiration and possible volume overload  - Continue Atrovent nebs, added Symbicort, on Zithromax and Rocephin  - However lung sounds considerably course today,  started on dysphagia 3 diet yesterday - Repeat 2 view chest x-ray today, DC IV fluids, BNP, Lasix one dose today. If creatinine remains stable, will continue Lasix 10 mg (or 20mg ) PO  from tomorrow as per her home dose. - I will change the IV antibiotics to Levaquin    Nausea and vomiting: No active nausea and vomiting,  tolerating diet, DC IV fluids    Acute renal insufficiency:  resolved, discontinue IV fluids    Encephalopathy: history of advanced severe  Dementia, currently closer to the baseline   DVT Prophylaxis: Heparin subcutaneous  Code Status: DO NOT RESUSCITATE  Disposition  patient's mental status is closer to her baseline, I had a long discussion with patient's daughter, Olegario Messier at bedside yesterday evening. Once more stable medically with UTI and COPD/PNA (per daughter, patient was a heavy smoker, 2ppd, throughout her life since the age of 24), she wants her mother to be dc'ed back to [facility] (her current SNF) hopefully later this week.    LOS: 4 days   Theophilus Walz M.D. [facility] 10/15/2011, 12:29 PM Pager: [phone]

## 2011-10-15 NOTE — Progress Notes (Signed)
CSW reviewed chart and staffed pt with RNCM, aware of d/c planning needs when pt is medically ready and will continue to follow to facilitate d/c back to [facility].  Baxter Flattery, MSW [phone]

## 2011-10-15 NOTE — Progress Notes (Signed)
Speech Language Pathology Dysphagia Treatment  Patient Details Name: Jo Combs MRN: [id number] DOB: [birth year] Today's Date: 10/15/2011  SLP Assessment/Plan/Recommendation Assessment / Recommendations / Plan Clinical Impression Statement: Pt reportedly tolerating mechanical soft diet. With first presentation of liquids pt with increased apraxia and delay in transit, swallow initiation. With subsequent trials pt exhibits a more automatic, timely reposnse, No overt s/s of aspiration observed. Pt recommended to continue diet, no f/u needed.  Continue with Current Diet: Dysphagia 3 (mechanical soft);Thin liquid Liquids provided via: Cup Medication Administration: Whole meds with puree Supervision: Staff feed patient Compensations: Slow rate;Small sips/bites Postural Changes and/or Swallow Maneuvers: Seated upright 90 degrees Oral Care Recommendations: Oral care BID Plan: Discharge SLP treatment due to (comment);All goals met (No further needs. )  General Temperature Spikes Noted: No Respiratory Status: Supplemental O2 delivered via (comment) Behavior/Cognition: Alert;Doesn't follow directions;Pleasant mood;Confused Oral Cavity - Dentition: Adequate natural dentition Patient Positioning: Upright in bed   Dysphagia Treatment Treatment focused on: Skilled observation of diet tolerance Treatment Methods/Modalities: Skilled observation Patient observed directly with PO's: Yes Type of PO's observed: Regular;Thin liquids Feeding: Needs assist Liquids provided via: Cup Pharyngeal Phase Signs & Symptoms: Suspected delayed swallow initiation  Harlon Ditty, MA CCC-SLP [phone]  Claudine Mouton 10/15/2011, 4:08 PM

## 2011-10-15 NOTE — Progress Notes (Signed)
   CARE MANAGEMENT NOTE 10/15/2011  Patient:  Jo, Combs   Account Number:  [id number]  Date Initiated:  10/14/2011  Documentation initiated by:  Onnie Boer  Subjective/Objective Assessment:   PT WAS ADMITTED WITH UTI     Action/Plan:   PROGRESSION OF CARE AND DISCHARGE PLANNING   Anticipated DC Date:  10/17/2011   Anticipated DC Plan:  ASSISTED LIVING / REST HOME  In-house referral  Clinical Social Worker      DC Planning Services  CM consult      Choice offered to / List presented to:             Status of service:  In process, will continue to follow Medicare Important Message given?   (If response is "NO", the following Medicare IM given date fields will be blank) Date Medicare IM given:   Date Additional Medicare IM given:    Discharge Disposition:    Per UR Regulation:  Reviewed for med. necessity/level of care/duration of stay  If discussed at Long Length of Stay Meetings, dates discussed:    Comments:  10/15/2011  [address] Russian RN, [place] [phone] [facility]:  [phone] Memory Care Unit Spoke with Darnelle Bos regarding paperwork needed for transfer back.  She stated they will need an FL2, H&P, DC summary.   FAX:  [phone]  10/14/11 Onnie Boer, RN, BSN UR COMPLETED [phone]

## 2011-10-15 NOTE — Progress Notes (Signed)
   CARE MANAGEMENT NOTE 10/15/2011  Patient:  Jo Combs, Jo Combs   Account Number:  [id number]  Date Initiated:  10/14/2011  Documentation initiated by:  Onnie Boer  Subjective/Objective Assessment:   PT WAS ADMITTED WITH UTI     Action/Plan:   PROGRESSION OF CARE AND DISCHARGE PLANNING   Anticipated DC Date:  10/17/2011   Anticipated DC Plan:  ASSISTED LIVING / REST HOME  In-house referral  Clinical Social Worker      DC Planning Services  CM consult      Choice offered to / List presented to:             Status of service:  In process, will continue to follow Medicare Important Message given?   (If response is "NO", the following Medicare IM given date fields will be blank) Date Medicare IM given:   Date Additional Medicare IM given:    Discharge Disposition:    Per UR Regulation:  Reviewed for med. necessity/level of care/duration of stay  If discussed at Long Length of Stay Meetings, dates discussed:    Comments:  10/15/2011 [address]arlyne Russian RN, [place] [phone] [facility]:  contact person Bradly Bienenstock  phone  [phone] requests fax copy of FL2 faxed to [phone] prior to transfer copy of H&P and DC summary with patient, no TB test needed  10/15/2011  [address] Darlyne Russian RN, [place] [phone] [facility]:  [phone] Memory Care Unit Spoke with Darnelle Bos regarding paperwork needed for transfer back.  She stated they will need an FL2, H&P, DC summary.   FAX:  [phone]  10/14/11 Onnie Boer, RN, BSN UR COMPLETED [phone]

## 2011-10-16 NOTE — Progress Notes (Signed)
PROGRESS NOTE  Jo Combs EAV:[id number] DOB: 1927-05-23 DOA: 10/11/2011 PCP: Florentina Jenny, MD, MD  Pt is listed under name Jo Combs, however pt's name is Jo Combs.  Brief narrative: 78 year old woman presented for evaluation of nausea and vomiting. Diagnosed with urinary tract infection.  Past medical history: Organic brain syndrome, hypertension, hypothyroidism, COPD  Consultants:  Speech therapy: Dysphagia 3 diet. Thin liquids. Medications: Pure. Staff to see patient.  Antibiotics:  March 31-April 2: Zithromax  March 30-April 2: Rocephin  March 31: Levaquin  Interim History: Chart reviewed in detail and summarized as above.  Subjective: History unreliable. Speech garbled.  Objective: Filed Vitals:   10/16/11 0700 10/16/11 0900 10/16/11 1000 10/16/11 1400  BP: 138/64  112/65 120/80  Pulse: 50  57 66  Temp:   97.5 F (36.4 C) 97.4 F (36.3 C)  TempSrc: Oral  Oral Oral  Resp: 17  20 16   Weight:      SpO2: 95% 94% 97% 90%    Intake/Output Summary (Last 24 hours) at 10/16/11 1730 Last data filed at 10/16/11 1100  Gross per 24 hour  Intake    320 ml  Output      0 ml  Net    320 ml    Exam:   General:  Appears calm and comfortable. Does not follow commands.  Cardiovascular: Regular rate and rhythm. No murmur, rub, gallop. No lower extremity edema.  Respiratory: Clear to auscultation bilaterally. No wheezes, rales, rhonchi. Normal respiratory effort.  Psychiatric: Speech and appropriate.  Data Reviewed: Basic Metabolic Panel:  Lab 10/15/11 7829 10/14/11 0550 10/13/11 0844 10/12/11 0700 10/11/11 2303  NA 138 139 142 144 142  K 3.9 3.8 -- -- --  CL 102 103 107 106 105  CO2 29 27 24 29  --  GLUCOSE 100* 96 133* 85 127*  BUN 15 19 26* 35* 34*  CREATININE 0.97 1.15* 1.26* 1.47* 1.60*  CALCIUM 8.9 8.6 8.9 9.7 --  MG -- -- -- -- --  PHOS -- -- -- -- --   Liver Function Tests:  Lab 10/12/11 0700  AST 22  ALT 14  ALKPHOS 100  BILITOT 0.4    PROT 6.3  ALBUMIN 2.6*   CBC:  Lab 10/13/11 0844 10/12/11 0700 10/11/11 2303 10/11/11 2205  WBC 9.5 10.8* -- 16.5*  NEUTROABS -- -- -- 13.3*  HGB 11.4* 11.9* 12.9 13.8  HCT 34.3* 36.0 38.0 40.7  MCV 95.5 95.5 -- 94.0  PLT 156 153 -- 184   CBG:  Lab 10/16/11 0749 10/15/11 0746 10/14/11 0732 10/13/11 0730 10/12/11 0840  GLUCAP 95 91 96 108* 152*    Recent Results (from the past 240 hour(s))  CULTURE, BLOOD (ROUTINE X 2)     Status: Normal (Preliminary result)   Collection Time   10/11/11 10:05 PM      Component Value Range Status Comment   Specimen Description BLOOD RIGHT HAND   Final    Special Requests BOTTLES DRAWN AEROBIC AND ANAEROBIC [facility]   Final    Culture  Setup Time [id number]   Final    Culture     Final    Value:        BLOOD CULTURE RECEIVED NO GROWTH TO DATE CULTURE WILL BE HELD FOR 5 DAYS BEFORE ISSUING A FINAL NEGATIVE REPORT   Report Status PENDING   Incomplete   CULTURE, BLOOD (ROUTINE X 2)     Status: Normal (Preliminary result)   Collection Time   10/11/11 10:15 PM  Component Value Range Status Comment   Specimen Description BLOOD LEFT HAND   Final    Special Requests BOTTLES DRAWN AEROBIC ONLY 4CC   Final    Culture  Setup Time [id number]   Final    Culture     Final    Value:        BLOOD CULTURE RECEIVED NO GROWTH TO DATE CULTURE WILL BE HELD FOR 5 DAYS BEFORE ISSUING A FINAL NEGATIVE REPORT   Report Status PENDING   Incomplete   URINE CULTURE     Status: Normal   Collection Time   10/11/11 11:23 PM      Component Value Range Status Comment   Specimen Description URINE, CATHETERIZED   Final    Special Requests NONE   Final    Culture  Setup Time [id number]   Final    Colony Count >=100,000 COLONIES/ML   Final    Culture     Final    Value: ESCHERICHIA COLI     Note: Two isolates with different morphologies were identified as the same organism.The most resistant organism was reported.   Report Status 10/15/2011 FINAL   Final     Organism ID, Bacteria ESCHERICHIA COLI   Final   MRSA PCR SCREENING     Status: Normal   Collection Time   10/12/11  2:19 AM      Component Value Range Status Comment   MRSA by PCR NEGATIVE  NEGATIVE  Final   CULTURE, RESPIRATORY     Status: Normal   Collection Time   10/12/11  8:16 AM      Component Value Range Status Comment   Specimen Description OTHER   Final    Special Requests NT SUCTION   Final    Gram Stain     Final    Value: ABUNDANT WBC PRESENT, PREDOMINANTLY PMN     RARE SQUAMOUS EPITHELIAL CELLS PRESENT     RARE GRAM POSITIVE COCCI IN PAIRS   Culture     Final    Value: MODERATE MORAXELLA CATARRHALIS(BRANHAMELLA)     Note: BETA LACTAMASE POSITIVE   Report Status 10/14/2011 FINAL   Final      Studies: Dg Chest 2 View  10/15/2011  *RADIOLOGY REPORT*  Clinical Data: Coarse breath sounds. Recent vomiting.  CHEST - 2 VIEW  Comparison: 10/11/2011  Findings: Since the prior study the patient has developed an infiltrate and a small effusion at the left lung base.  The heart size and vascularity are normal.  Right lung is clear. No acute osseous abnormality.  IMPRESSION: New left lower lobe infiltrate and small effusion.  Original Report Authenticated By: Gwynn Burly, M.D.   Scheduled Meds:   . budesonide-formoterol  2 puff Inhalation BID  . docusate sodium  100 mg Oral Daily  . heparin  5,000 Units Subcutaneous Q8H  . ipratropium  0.5 mg Nebulization TID  . levalbuterol  0.63 mg Nebulization TID  . levofloxacin (LEVAQUIN) IV  750 mg Intravenous Q24H  . levothyroxine  50 mcg Oral QAC breakfast  . multivitamin  1 tablet Oral Daily   Continuous Infusions:    Assessment/Plan:  1. Acute encephalopathy superimposed on organic brain syndrome: Resolved by history. 2. Escherichia coli UTI: Sensitive to Levaquin. Blood cultures no growth to date. 3. Pneumonia: Continue Levaquin. 4. Nausea/vomiting: Resolved. 5. Acute renal failure: Resolved with IV fluids. 6. COPD:  Stable. 7. Organic brain syndrome: Stable.  Code Status: DO NOT RESUSCITATE Family Communication: Panda Crossin (Daughter)  [phone] Disposition Plan: Return to skilled nursing facility one to 2 days.   Brendia Sacks, MD  [facility] Pager [phone] 10/16/2011, 5:30 PM    LOS: 5 days

## 2011-10-17 LAB — GLUCOSE, CAPILLARY: Glucose-Capillary: 98 mg/dL (ref 70–99)

## 2011-10-17 MED ORDER — NYSTATIN 100000 UNIT/GM EX CREA
TOPICAL_CREAM | Freq: Two times a day (BID) | CUTANEOUS | Status: DC
  Administered 2011-10-17 – 2011-10-18 (×2): via TOPICAL
  Filled 2011-10-17 (×2): qty 15

## 2011-10-17 MED ORDER — LEVOFLOXACIN 750 MG PO TABS
750.0000 mg | ORAL_TABLET | Freq: Every day | ORAL | Status: DC
  Administered 2011-10-18: 750 mg via ORAL
  Filled 2011-10-17: qty 1

## 2011-10-17 NOTE — Progress Notes (Signed)
PROGRESS NOTE  Jo Combs FAO:[id number] DOB: [birth year] DOA: 10/11/2011 PCP: Florentina Jenny, MD, MD  Pt is listed under name Jo Combs, however pt's name is Jo Combs.  Brief narrative: 78 year old woman presented for evaluation of nausea and vomiting. Diagnosed with urinary tract infection.  Past medical history: Organic brain syndrome, hypertension, hypothyroidism, COPD  Consultants:  Speech therapy: Dysphagia 3 diet. Thin liquids. Medications: Pure. Staff to see patient.  Antibiotics:  March 31-April 2: Zithromax  March 30-April 2: Rocephin  March 31: Levaquin  Interim History: No interval documentation. Afebrile, vital signs stable.  Subjective: History unreliable. Per staff patient ate well at breakfast.  Objective: Filed Vitals:   10/16/11 1950 10/16/11 2100 10/17/11 0630 10/17/11 0809  BP:  128/57 105/65   Pulse:  76 99   Temp:  97.2 F (36.2 C) 98 F (36.7 C)   TempSrc:  Oral Oral   Resp:  16 20   Weight:  69.536 kg (153 lb 4.8 oz)    SpO2: 93% 87% 95% 92%    Intake/Output Summary (Last 24 hours) at 10/17/11 0853 Last data filed at 10/16/11 1100  Gross per 24 hour  Intake     80 ml  Output      0 ml  Net     80 ml    Exam:   General:  Appears calm and comfortable. Does not follow commands.  Cardiovascular: Regular rate and rhythm. No murmur, rub, gallop. No lower extremity edema.  Respiratory: Clear to auscultation bilaterally. No wheezes, rales, rhonchi. Normal respiratory effort.  Data Reviewed: Basic Metabolic Panel:  Lab 10/15/11 6962 10/14/11 0550 10/13/11 0844 10/12/11 0700 10/11/11 2303  NA 138 139 142 144 142  K 3.9 3.8 -- -- --  CL 102 103 107 106 105  CO2 29 27 24 29  --  GLUCOSE 100* 96 133* 85 127*  BUN 15 19 26* 35* 34*  CREATININE 0.97 1.15* 1.26* 1.47* 1.60*  CALCIUM 8.9 8.6 8.9 9.7 --  MG -- -- -- -- --  PHOS -- -- -- -- --   Liver Function Tests:  Lab 10/12/11 0700  AST 22  ALT 14  ALKPHOS 100  BILITOT 0.4    PROT 6.3  ALBUMIN 2.6*   CBC:  Lab 10/13/11 0844 10/12/11 0700 10/11/11 2303 10/11/11 2205  WBC 9.5 10.8* -- 16.5*  NEUTROABS -- -- -- 13.3*  HGB 11.4* 11.9* 12.9 13.8  HCT 34.3* 36.0 38.0 40.7  MCV 95.5 95.5 -- 94.0  PLT 156 153 -- 184   CBG:  Lab 10/17/11 0734 10/16/11 0749 10/15/11 0746 10/14/11 0732 10/13/11 0730  GLUCAP 98 95 91 96 108*    Recent Results (from the past 240 hour(s))  CULTURE, BLOOD (ROUTINE X 2)     Status: Normal (Preliminary result)   Collection Time   10/11/11 10:05 PM      Component Value Range Status Comment   Specimen Description BLOOD RIGHT HAND   Final    Special Requests BOTTLES DRAWN AEROBIC AND ANAEROBIC [facility]   Final    Culture  Setup Time [id number]   Final    Culture     Final    Value:        BLOOD CULTURE RECEIVED NO GROWTH TO DATE CULTURE WILL BE HELD FOR 5 DAYS BEFORE ISSUING A FINAL NEGATIVE REPORT   Report Status PENDING   Incomplete   CULTURE, BLOOD (ROUTINE X 2)     Status: Normal (Preliminary result)   Collection Time  10/11/11 10:15 PM      Component Value Range Status Comment   Specimen Description BLOOD LEFT HAND   Final    Special Requests BOTTLES DRAWN AEROBIC ONLY 4CC   Final    Culture  Setup Time [id number]   Final    Culture     Final    Value:        BLOOD CULTURE RECEIVED NO GROWTH TO DATE CULTURE WILL BE HELD FOR 5 DAYS BEFORE ISSUING A FINAL NEGATIVE REPORT   Report Status PENDING   Incomplete   URINE CULTURE     Status: Normal   Collection Time   10/11/11 11:23 PM      Component Value Range Status Comment   Specimen Description URINE, CATHETERIZED   Final    Special Requests NONE   Final    Culture  Setup Time [id number]   Final    Colony Count >=100,000 COLONIES/ML   Final    Culture     Final    Value: ESCHERICHIA COLI     Note: Two isolates with different morphologies were identified as the same organism.The most resistant organism was reported.   Report Status 10/15/2011 FINAL   Final     Organism ID, Bacteria ESCHERICHIA COLI   Final   MRSA PCR SCREENING     Status: Normal   Collection Time   10/12/11  2:19 AM      Component Value Range Status Comment   MRSA by PCR NEGATIVE  NEGATIVE  Final   CULTURE, RESPIRATORY     Status: Normal   Collection Time   10/12/11  8:16 AM      Component Value Range Status Comment   Specimen Description OTHER   Final    Special Requests NT SUCTION   Final    Gram Stain     Final    Value: ABUNDANT WBC PRESENT, PREDOMINANTLY PMN     RARE SQUAMOUS EPITHELIAL CELLS PRESENT     RARE GRAM POSITIVE COCCI IN PAIRS   Culture     Final    Value: MODERATE MORAXELLA CATARRHALIS(BRANHAMELLA)     Note: BETA LACTAMASE POSITIVE   Report Status 10/14/2011 FINAL   Final      Studies: Dg Chest 2 View  10/15/2011  *RADIOLOGY REPORT*  Clinical Data: Coarse breath sounds. Recent vomiting.  CHEST - 2 VIEW  Comparison: 10/11/2011  Findings: Since the prior study the patient has developed an infiltrate and a small effusion at the left lung base.  The heart size and vascularity are normal.  Right lung is clear. No acute osseous abnormality.  IMPRESSION: New left lower lobe infiltrate and small effusion.  Original Report Authenticated By: Gwynn Burly, M.D.   Scheduled Meds:    . budesonide-formoterol  2 puff Inhalation BID  . docusate sodium  100 mg Oral Daily  . heparin  5,000 Units Subcutaneous Q8H  . ipratropium  0.5 mg Nebulization TID  . levalbuterol  0.63 mg Nebulization TID  . levofloxacin (LEVAQUIN) IV  750 mg Intravenous Q24H  . levothyroxine  50 mcg Oral QAC breakfast  . multivitamin  1 tablet Oral Daily   Continuous Infusions:    Assessment/Plan:  1. Acute encephalopathy superimposed on organic brain syndrome: Resolved by history. 2. Escherichia coli UTI: Sensitive to Levaquin. Blood cultures no growth to date. 3. Pneumonia: Continue Levaquin. 4. Nausea/vomiting: Resolved. 5. Acute renal failure: Resolved with IV fluids. 6. COPD:  Stable. 7. Dementia/Organic brain syndrome: Stable.  Discussed  with daughter by telephone today. Agreeable to discharge tomorrow.  Code Status: DO NOT RESUSCITATE Family Communication: Sherena Machorro (Daughter) [phone] Disposition Plan: Return to skilled nursing facility April 5.  Brendia Sacks, MD  [facility] Pager [phone] 10/17/2011, 8:53 AM    LOS: 6 days

## 2011-10-17 NOTE — Progress Notes (Signed)
MD  was page about IV infiltrated,order received for no peripheral IV at the moment.Also pt. Has redness around perineal area,medication has been order.Keep monitoring pt. closely

## 2011-10-17 NOTE — Progress Notes (Signed)
   CARE MANAGEMENT NOTE 10/17/2011  Patient:  Jo, Combs   Account Number:  [id number]  Date Initiated:  10/14/2011  Documentation initiated by:  Onnie Boer  Subjective/Objective Assessment:   PT WAS ADMITTED WITH UTI     Action/Plan:   PROGRESSION OF CARE AND DISCHARGE PLANNING   Anticipated DC Date:  10/17/2011   Anticipated DC Plan:  ASSISTED LIVING / REST HOME  In-house referral  Clinical Social Worker      DC Planning Services  CM consult      Choice offered to / List presented to:             Status of service:  In process, will continue to follow Medicare Important Message given?   (If response is "NO", the following Medicare IM given date fields will be blank) Date Medicare IM given:   Date Additional Medicare IM given:    Discharge Disposition:    Per UR Regulation:  Reviewed for med. necessity/level of care/duration of stay  If discussed at Long Length of Stay Meetings, dates discussed:    Comments:  10/15/2011 1200 Darlyne Russian RN, [place] [phone] [facility]:  contact person Bradly Bienenstock  phone  [phone] requests fax copy of FL2 faxed to [phone] prior to transfer copy of H&P and DC summary with patient, no TB test needed  10/15/2011  1200 Darlyne Russian RN, [place] [phone] [facility]:  [phone] Memory Care Unit Spoke with Darnelle Bos regarding paperwork needed for transfer back.  She stated they will need an FL2, H&P, DC summary.   FAX:  [phone]  10/14/11 Onnie Boer, RN, BSN UR COMPLETED [phone]

## 2011-10-17 NOTE — Evaluation (Addendum)
Physical Therapy Evaluation and Discharge.  Patient Details Name: Jo Combs MRN: [id number] DOB: 10/27/1926 Today's Date: 10/17/2011  Problem List:  Patient Active Problem List  Diagnoses  . Hypertension  . Hypothyroidism  . UTI (lower urinary tract infection)  . Leukocytosis  . Nausea and vomiting  . Acute renal insufficiency  . Encephalopathy    Past Medical History:  Past Medical History  Diagnosis Date  . Hypertension   . Hypothyroidism   . Organic brain syndrome   . Chronic airway obstruction     Emphysema and fibrosis on CXR  . Insomnia   . Heart failure   . Edema    Past Surgical History: History reviewed. No pertinent past surgical history.  PT Assessment/Plan/Recommendation PT Assessment Clinical Impression Statement: Pt is an 78 y/o female from SNF memory unit admitted with UTI.  Pt was total assist for all mobility at SNF prior to admission.  Pt functioning at her baseline level.  Pt lack the cognitive ability to participate in PT.  Acute Pt signing off . PT Recommendation/Assessment: Patent does not need any further PT services No Skilled PT: Patient unable to participate in therapy;Patient at baseline level of functioning PT Recommendation Follow Up Recommendations: No PT follow up Equipment Recommended: None recommended by PT PT Goals  Acute Rehab PT Goals PT Goal Formulation: With family  PT Evaluation Precautions/Restrictions  Precautions Precautions: Fall Restrictions Weight Bearing Restrictions: No Prior Functioning  Home Living Lives With: Other (Comment) (SNF) Receives Help From: Other (Comment) (SNF) Type of Home: Skilled Nursing Facility Prior Function Level of Independence: Needs assistance with ADLs;Needs assistance with homemaking;Needs assistance with tranfers (total assist for all mobility) Able to Take Stairs?: No Driving: No Vocation: Retired Leisure: Hobbies-no Cognition Cognition Arousal/Alertness: Lethargic Overall  Cognitive Status: History of cognitive impairments History of Cognitive Impairment: Appears at baseline functioning Orientation Level: Disoriented X4 (limited verbal communication.  ) Sensation/Coordination Sensation Light Touch: Not tested Stereognosis: Not tested Hot/Cold: Not tested Proprioception: Not tested Coordination Gross Motor Movements are Fluid and Coordinated: No Fine Motor Movements are Fluid and Coordinated: Not tested Extremity Assessment RUE Assessment RUE Assessment: Not tested LUE Assessment LUE Assessment: Not tested RLE Assessment RLE Assessment: Not tested LLE Assessment LLE Assessment: Not tested Mobility (including Balance) Bed Mobility Bed Mobility: Yes Rolling Left: 1: +2 Total assist Rolling Left Details (indicate cue type and reason): total assist for all aspects of transition.  Pt pushes away from desired direction.  Supine to Sit: 1: +2 Total assist;HOB elevated (Comment degrees) Supine to Sit Details (indicate cue type and reason): Total assist for all aspects of transfer pt pushing away from desired direction.   Sit to Supine: 1: +2 Total assist;HOB flat Sit to Supine - Details (indicate cue type and reason): total assist for bilateral LE and trunk to prevent pt from falling into supine.  No active participation from pt.  Scooting to [facility]: 1: +2 Total assist Scooting to [facility] Details (indicate cue type and reason): total assist using draw pads.  Transfers Transfers: Yes Sit to Stand: From bed;1: +2 Total assist;Patient percentage (comment) (pt <10%) Sit to Stand Details (indicate cue type and reason): total assist for all aspects of transfer. Blocking bilateral feet to prevent pt from sliding forward.  Pt unable to extend bilateral knee in standing.  Pt presents with strong posterior lean in standing.  Stand to Sit: 1: +1 Total assist;To chair/3-in-1;To bed Stand to Sit Details: total assist for all aspects of the transfer.  Stand Pivot  Transfers: 1:  +2 Total assist (2 trials) Stand Pivot Transfer Details (indicate cue type and reason): Total assist for all aspects of transfer from bed to recliner and back to bed.  Ambulation/Gait Ambulation/Gait: No Stairs: No Wheelchair Mobility Wheelchair Mobility: No  Posture/Postural Control Posture/Postural Control: Postural limitations Postural Limitations: posterior and left lateral lean in sitting and standing. Balance Balance Assessed: Yes Static Sitting Balance Static Sitting - Balance Support: Bilateral upper extremity supported;Feet supported Static Sitting - Level of Assistance: 1: +1 Total assist Static Sitting - Comment/# of Minutes: pt unable to maintain sitting balance without constant assist to prevent pt from falling back into supine.  Exercise    End of Session PT - End of Session Equipment Utilized During Treatment: Gait belt Activity Tolerance: Treatment limited secondary to medical complications (Comment) (altered mental status) Patient left: in bed;with call bell in reach;with bed alarm set;with family/visitor present (daughter present for entire session. ) Nurse Communication: Mobility status for transfers General Behavior During Session: Other (comment) Cognition: Impaired, at baseline  Jo Combs 10/17/2011, 3:10 PM Baby Stairs L. Jaythen Hamme DPT [phone].

## 2011-10-17 NOTE — Progress Notes (Signed)
Nurse was asis ting the tech with pt. Bath.Redness was noticed on between pt's thighs and vaginal area.MD. Has been notified,orders were received

## 2011-10-18 LAB — GLUCOSE, CAPILLARY: Glucose-Capillary: 104 mg/dL — ABNORMAL HIGH (ref 70–99)

## 2011-10-18 LAB — CULTURE, BLOOD (ROUTINE X 2)
Culture  Setup Time: 201303300258
Culture: NO GROWTH
Culture: NO GROWTH

## 2011-10-18 MED ORDER — LEVOFLOXACIN 750 MG PO TABS: 750.0000 mg | ORAL_TABLET | Freq: Every day | ORAL | Status: AC

## 2011-10-18 MED ORDER — ALBUTEROL SULFATE (5 MG/ML) 0.5% IN NEBU: 2.5000 mg | INHALATION_SOLUTION | RESPIRATORY_TRACT | Status: DC | PRN

## 2011-10-18 NOTE — Discharge Summary (Signed)
Physician Discharge Summary  Jo Combs XBJ:[id number] DOB: 1927/02/08 DOA: 10/11/2011  PCP: Florentina Jenny, MD, MD  Admit date: 10/11/2011 Discharge date: 10/18/2011  Discharge Diagnoses:  1. Acute encephalopathy superimposed on chronic organic brain syndrome 2. Escherichia coli urinary tract infection 3. Pneumonia 4. Nausea/vomiting, resolved 5. Acute renal failure, resolved 6. Dementia/organic brain syndrome, stable  Discharge Condition: Improved  Disposition: Return to skilled nursing facility  History of present illness:  78 year old woman presented for evaluation of nausea and vomiting. Diagnosed with urinary tract infection.  Hospital Course:  The patient was admitted to medical floor and treated with IV antibiotics. Her encephalopathy gradually improved to baseline. She will complete a course of oral antibiotics for both her urinary tract infection and pneumonia. Nausea and vomiting quickly resolved with supportive care as did acute renal failure. Patient is stable for discharge. 1. Acute encephalopathy superimposed on organic brain syndrome: Resolved. Secondary to acute infection. 2. Escherichia coli UTI: Sensitive to Levaquin, last dose April 7. Blood cultures no growth to date.  3. Pneumonia: Stable. No oxygen requirement. Continue Levaquin. Last dose April 7.  4. Nausea/vomiting: Resolved. Likely secondary to acute infection. 5. Acute renal failure: Resolved with IV fluids. Resume Lasix for now but consider discontinuing in the outpatient setting. 6. Dementia/Organic brain syndrome: Stable.  Consultants:  Speech therapy: Dysphagia 3 diet. Thin liquids. Medications whole with pure. Staff to feed patient.   Physical therapy: No followup recommended.  Antibiotics:  March 31-April 2: Zithromax   March 30-April 2: Rocephin   March 31-April 7: Levaquin  Discharge Instructions  Discharge Orders    Future Orders Please Complete By Expires   Diet general      Scheduling  Instructions:   Dysphagia 3 diet. Thin liquids. Medications whole with pure. Staff to feed patient.   Comments:   Dysphagia 3 diet. Thin liquids. Medications whole with pure. Staff to feed patient.      Medication List  As of 10/18/2011  9:16 AM   STOP taking these medications         guaifenesin 100 MG/5ML syrup      hydrocerin Crea      hydrocortisone cream 1 %      miconazole 2 % cream         TAKE these medications         acetaminophen 500 MG tablet   Commonly known as: TYLENOL   Take 1,000 mg by mouth every 8 (eight) hours as needed. For pain      albuterol (5 MG/ML) 0.5% nebulizer solution   Commonly known as: PROVENTIL   Take 0.5 mLs (2.5 mg total) by nebulization every 4 (four) hours as needed for wheezing.      calcium-vitamin D 500-200 MG-UNIT per tablet   Commonly known as: OSCAL WITH D   Take 1 tablet by mouth daily.      docusate 50 MG/5ML liquid   Commonly known as: COLACE   Take 50 mg by mouth daily.      furosemide 20 MG tablet   Commonly known as: LASIX   Take 10 mg by mouth daily.      levofloxacin 750 MG tablet   Commonly known as: LEVAQUIN   Take 1 tablet (750 mg total) by mouth daily. Last dose April 7.      levothyroxine 50 MCG tablet   Commonly known as: SYNTHROID, LEVOTHROID   Take 50 mcg by mouth daily.      magnesium hydroxide 400 MG/5ML suspension   Commonly  known as: MILK OF MAGNESIA   Take 30 mLs by mouth daily as needed. For constipation      mineral oil external liquid   Place 2 drops in ear(s) once a week.      multivitamin Tabs tablet   Take 1 tablet by mouth daily.      polyvinyl alcohol 1.4 % ophthalmic solution   Commonly known as: LIQUIFILM TEARS   Place 2 drops into both eyes every 4 (four) hours as needed. For dry eyes      PRESCRIPTION MEDICATION   Apply 1 application topically 2 (two) times daily. Nystatin 100,000 units/1 gm Powder.   To breast area           Follow-up Information    Follow up with  TRIPP,HENRY, MD in 1 week.         The results of significant diagnostics from this hospitalization (including imaging, microbiology, ancillary and laboratory) are listed below for reference.    Significant Diagnostic Studies: Dg Chest 2 View  10/15/2011  *RADIOLOGY REPORT*  Clinical Data: Coarse breath sounds. Recent vomiting.  CHEST - 2 VIEW  Comparison: 10/11/2011  Findings: Since the prior study the patient has developed an infiltrate and a small effusion at the left lung base.  The heart size and vascularity are normal.  Right lung is clear. No acute osseous abnormality.  IMPRESSION: New left lower lobe infiltrate and small effusion.  Original Report Authenticated By: Gwynn Burly, M.D.   Dg Chest Port 1 View  10/11/2011  *RADIOLOGY REPORT*  Clinical Data: Vomiting and shortness of breath.  Unresponsive patient.  PORTABLE CHEST - 1 VIEW  Comparison: None.  Findings: Borderline heart size and pulmonary vascularity. Emphysematous changes in the lungs with scattered fibrosis.  No focal airspace consolidation.  No blunting of costophrenic angles. No obvious pneumothorax although portions of the lung apices are excluded from the field of view.  Old right rib fractures demonstrated healing.  Calcified and tortuous aorta.  IMPRESSION: Emphysematous changes and scattered fibrosis in the lungs.  No evidence of active pulmonary disease.  Original Report Authenticated By: Marlon Pel, M.D.    Microbiology: Recent Results (from the past 240 hour(s))  CULTURE, BLOOD (ROUTINE X 2)     Status: Normal (Preliminary result)   Collection Time   10/11/11 10:05 PM      Component Value Range Status Comment   Specimen Description BLOOD RIGHT HAND   Final    Special Requests BOTTLES DRAWN AEROBIC AND ANAEROBIC [facility]   Final    Culture  Setup Time [id number]   Final    Culture     Final    Value:        BLOOD CULTURE RECEIVED NO GROWTH TO DATE CULTURE WILL BE HELD FOR 5 DAYS BEFORE ISSUING A FINAL  NEGATIVE REPORT   Report Status PENDING   Incomplete   CULTURE, BLOOD (ROUTINE X 2)     Status: Normal (Preliminary result)   Collection Time   10/11/11 10:15 PM      Component Value Range Status Comment   Specimen Description BLOOD LEFT HAND   Final    Special Requests BOTTLES DRAWN AEROBIC ONLY 4CC   Final    Culture  Setup Time [id number]   Final    Culture     Final    Value:        BLOOD CULTURE RECEIVED NO GROWTH TO DATE CULTURE WILL BE HELD FOR 5 DAYS BEFORE ISSUING A  FINAL NEGATIVE REPORT   Report Status PENDING   Incomplete   URINE CULTURE     Status: Normal   Collection Time   10/11/11 11:23 PM      Component Value Range Status Comment   Specimen Description URINE, CATHETERIZED   Final    Special Requests NONE   Final    Culture  Setup Time [id number]   Final    Colony Count >=100,000 COLONIES/ML   Final    Culture     Final    Value: ESCHERICHIA COLI     Note: Two isolates with different morphologies were identified as the same organism.The most resistant organism was reported.   Report Status 10/15/2011 FINAL   Final    Organism ID, Bacteria ESCHERICHIA COLI   Final   MRSA PCR SCREENING     Status: Normal   Collection Time   10/12/11  2:19 AM      Component Value Range Status Comment   MRSA by PCR NEGATIVE  NEGATIVE  Final   CULTURE, RESPIRATORY     Status: Normal   Collection Time   10/12/11  8:16 AM      Component Value Range Status Comment   Specimen Description OTHER   Final    Special Requests NT SUCTION   Final    Gram Stain     Final    Value: ABUNDANT WBC PRESENT, PREDOMINANTLY PMN     RARE SQUAMOUS EPITHELIAL CELLS PRESENT     RARE GRAM POSITIVE COCCI IN PAIRS   Culture     Final    Value: MODERATE MORAXELLA CATARRHALIS(BRANHAMELLA)     Note: BETA LACTAMASE POSITIVE   Report Status 10/14/2011 FINAL   Final      Labs: Basic Metabolic Panel:  Lab 10/15/11 6578 10/14/11 0550 10/13/11 0844 10/12/11 0700 10/11/11 2303  NA 138 139 142 144 142  K 3.9  3.8 -- -- --  CL 102 103 107 106 105  CO2 29 27 24 29  --  GLUCOSE 100* 96 133* 85 127*  BUN 15 19 26* 35* 34*  CREATININE 0.97 1.15* 1.26* 1.47* 1.60*  CALCIUM 8.9 8.6 8.9 9.7 --  MG -- -- -- -- --  PHOS -- -- -- -- --   Liver Function Tests:  Lab 10/12/11 0700  AST 22  ALT 14  ALKPHOS 100  BILITOT 0.4  PROT 6.3  ALBUMIN 2.6*   CBC:  Lab 10/13/11 0844 10/12/11 0700 10/11/11 2303 10/11/11 2205  WBC 9.5 10.8* -- 16.5*  NEUTROABS -- -- -- 13.3*  HGB 11.4* 11.9* 12.9 13.8  HCT 34.3* 36.0 38.0 40.7  MCV 95.5 95.5 -- 94.0  PLT 156 153 -- 184   CBG:  Lab 10/18/11 0737 10/17/11 0734 10/16/11 0749 10/15/11 0746 10/14/11 0732  GLUCAP 104* 98 95 91 96    Time coordinating discharge: 25 minutes.  Signed:  Brendia Sacks, MD  [facility] 10/18/2011, 9:16 AM

## 2011-10-18 NOTE — Progress Notes (Signed)
Pt ready for DC to SNF. Paper gown placed on pt. Pt transported via stretcher by EMS personnel to exit if facility. [place], Rosanna Randy

## 2011-10-18 NOTE — Progress Notes (Signed)
PROGRESS NOTE  Jo Combs ZOX:[id number] DOB: 1926-08-05 DOA: 10/11/2011 PCP: Florentina Jenny, MD, MD  Brief narrative: 78 year old woman presented for evaluation of nausea and vomiting. Diagnosed with urinary tract infection.  Past medical history: Organic brain syndrome, hypertension, hypothyroidism, COPD  Consultants:  Speech therapy: Dysphagia 3 diet. Thin liquids. Medications: Pure. Staff to see patient.  Physical therapy: No followup recommended.  Antibiotics:  March 31-April 2: Zithromax  March 30-April 2: Rocephin  March 31: Levaquin  Interim History: Interval documentation reviewed. Afebrile, vital signs stable. Discussed with RN: No concerns. No issues overnight.  Subjective: Speech unintelligible at baseline.  Objective: Filed Vitals:   10/17/11 1400 10/17/11 1800 10/17/11 2102 10/18/11 0551  BP: 149/69 112/65 129/84 134/59  Pulse: 67 70 79 69  Temp: 99.2 F (37.3 C) 99.1 F (37.3 C) 98.6 F (37 C) 97.8 F (36.6 C)  TempSrc: Oral Oral Oral Oral  Resp: 16 16 20 18   Weight:   70.1 kg (154 lb 8.7 oz)   SpO2: 91% 90% 98% 98%    Intake/Output Summary (Last 24 hours) at 10/18/11 0853 Last data filed at 10/17/11 1700  Gross per 24 hour  Intake    240 ml  Output      0 ml  Net    240 ml    Exam:   General:  Appears calm and comfortable.   Cardiovascular: Regular rate and rhythm. No murmur, rub, gallop. No lower extremity edema. Digits and nails of the lower extremities appear unremarkable.  Respiratory: Clear to auscultation bilaterally. No wheezes, rales, rhonchi. Normal respiratory effort.  Groin: Erythema bilateral thighs and intertriginous areas.  Data Reviewed: Basic Metabolic Panel:  Lab 10/15/11 8119 10/14/11 0550 10/13/11 0844 10/12/11 0700 10/11/11 2303  NA 138 139 142 144 142  K 3.9 3.8 -- -- --  CL 102 103 107 106 105  CO2 29 27 24 29  --  GLUCOSE 100* 96 133* 85 127*  BUN 15 19 26* 35* 34*  CREATININE 0.97 1.15* 1.26* 1.47* 1.60*    CALCIUM 8.9 8.6 8.9 9.7 --  MG -- -- -- -- --  PHOS -- -- -- -- --   Liver Function Tests:  Lab 10/12/11 0700  AST 22  ALT 14  ALKPHOS 100  BILITOT 0.4  PROT 6.3  ALBUMIN 2.6*   CBC:  Lab 10/13/11 0844 10/12/11 0700 10/11/11 2303 10/11/11 2205  WBC 9.5 10.8* -- 16.5*  NEUTROABS -- -- -- 13.3*  HGB 11.4* 11.9* 12.9 13.8  HCT 34.3* 36.0 38.0 40.7  MCV 95.5 95.5 -- 94.0  PLT 156 153 -- 184   CBG:  Lab 10/18/11 0737 10/17/11 0734 10/16/11 0749 10/15/11 0746 10/14/11 0732  GLUCAP 104* 98 95 91 96    Recent Results (from the past 240 hour(s))  CULTURE, BLOOD (ROUTINE X 2)     Status: Normal (Preliminary result)   Collection Time   10/11/11 10:05 PM      Component Value Range Status Comment   Specimen Description BLOOD RIGHT HAND   Final    Special Requests BOTTLES DRAWN AEROBIC AND ANAEROBIC [facility]   Final    Culture  Setup Time [id number]   Final    Culture     Final    Value:        BLOOD CULTURE RECEIVED NO GROWTH TO DATE CULTURE WILL BE HELD FOR 5 DAYS BEFORE ISSUING A FINAL NEGATIVE REPORT   Report Status PENDING   Incomplete   CULTURE, BLOOD (ROUTINE X 2)  Status: Normal (Preliminary result)   Collection Time   10/11/11 10:15 PM      Component Value Range Status Comment   Specimen Description BLOOD LEFT HAND   Final    Special Requests BOTTLES DRAWN AEROBIC ONLY 4CC   Final    Culture  Setup Time [id number]   Final    Culture     Final    Value:        BLOOD CULTURE RECEIVED NO GROWTH TO DATE CULTURE WILL BE HELD FOR 5 DAYS BEFORE ISSUING A FINAL NEGATIVE REPORT   Report Status PENDING   Incomplete   URINE CULTURE     Status: Normal   Collection Time   10/11/11 11:23 PM      Component Value Range Status Comment   Specimen Description URINE, CATHETERIZED   Final    Special Requests NONE   Final    Culture  Setup Time [id number]   Final    Colony Count >=100,000 COLONIES/ML   Final    Culture     Final    Value: ESCHERICHIA COLI     Note: Two  isolates with different morphologies were identified as the same organism.The most resistant organism was reported.   Report Status 10/15/2011 FINAL   Final    Organism ID, Bacteria ESCHERICHIA COLI   Final   MRSA PCR SCREENING     Status: Normal   Collection Time   10/12/11  2:19 AM      Component Value Range Status Comment   MRSA by PCR NEGATIVE  NEGATIVE  Final   CULTURE, RESPIRATORY     Status: Normal   Collection Time   10/12/11  8:16 AM      Component Value Range Status Comment   Specimen Description OTHER   Final    Special Requests NT SUCTION   Final    Gram Stain     Final    Value: ABUNDANT WBC PRESENT, PREDOMINANTLY PMN     RARE SQUAMOUS EPITHELIAL CELLS PRESENT     RARE GRAM POSITIVE COCCI IN PAIRS   Culture     Final    Value: MODERATE MORAXELLA CATARRHALIS(BRANHAMELLA)     Note: BETA LACTAMASE POSITIVE   Report Status 10/14/2011 FINAL   Final      Studies: Dg Chest 2 View  10/15/2011  *RADIOLOGY REPORT*  Clinical Data: Coarse breath sounds. Recent vomiting.  CHEST - 2 VIEW  Comparison: 10/11/2011  Findings: Since the prior study the patient has developed an infiltrate and a small effusion at the left lung base.  The heart size and vascularity are normal.  Right lung is clear. No acute osseous abnormality.  IMPRESSION: New left lower lobe infiltrate and small effusion.  Original Report Authenticated By: Gwynn Burly, M.D.   Scheduled Meds:    . budesonide-formoterol  2 puff Inhalation BID  . docusate sodium  100 mg Oral Daily  . heparin  5,000 Units Subcutaneous Q8H  . ipratropium  0.5 mg Nebulization TID  . levalbuterol  0.63 mg Nebulization TID  . levofloxacin  750 mg Oral Daily  . levothyroxine  50 mcg Oral QAC breakfast  . multivitamin  1 tablet Oral Daily  . nystatin cream   Topical BID  . DISCONTD: levofloxacin (LEVAQUIN) IV  750 mg Intravenous Q24H   Continuous Infusions:    Assessment/Plan:  1. Acute encephalopathy superimposed on organic brain  syndrome: Resolved. 2. Escherichia coli UTI: Sensitive to Levaquin last dose April 7. Blood cultures  no growth to date. 3. Pneumonia: Continue Levaquin. Last dose April 7. 4. Nausea/vomiting: Resolved. 5. Acute renal failure: Resolved with IV fluids. 6. COPD: Stable. 7. Dementia/Organic brain syndrome: Stable.  Stable for discharge.  Code Status: DO NOT RESUSCITATE Family Communication: Jo Combs (Daughter) [phone] Disposition Plan: Return to skilled nursing facility today.  Brendia Sacks, MD  [facility] Pager [phone] 10/18/2011, 8:53 AM    LOS: 7 days

## 2011-10-18 NOTE — Progress Notes (Signed)
Patient medically ready for d/c back to [facility] ([facility] Care Unit[facility] today. Discharge information faxed to facility, was reviewed and no corrections needed. CSW facilitated transport to ALF via ambulance. Family contacted.  Genelle Bal, MSW, LCSW [phone]

## 2013-04-25 ENCOUNTER — Encounter (HOSPITAL_COMMUNITY): Payer: Self-pay | Admitting: Emergency Medicine

## 2013-04-25 ENCOUNTER — Emergency Department (HOSPITAL_COMMUNITY)
Admission: EM | Admit: 2013-04-25 | Discharge: 2013-04-25 | Disposition: A | Discharge status: Skilled Nursing Facility | Payer: Medicare Other | Attending: Emergency Medicine | Admitting: Emergency Medicine

## 2013-04-25 DIAGNOSIS — J449 Chronic obstructive pulmonary disease, unspecified: Secondary | ICD-10-CM | POA: Insufficient documentation

## 2013-04-25 DIAGNOSIS — Z792 Long term (current) use of antibiotics: Secondary | ICD-10-CM | POA: Insufficient documentation

## 2013-04-25 DIAGNOSIS — Z1389 Encounter for screening for other disorder: Secondary | ICD-10-CM | POA: Insufficient documentation

## 2013-04-25 DIAGNOSIS — Z79899 Other long term (current) drug therapy: Secondary | ICD-10-CM | POA: Insufficient documentation

## 2013-04-25 DIAGNOSIS — J4489 Other specified chronic obstructive pulmonary disease: Secondary | ICD-10-CM | POA: Insufficient documentation

## 2013-04-25 DIAGNOSIS — I509 Heart failure, unspecified: Secondary | ICD-10-CM | POA: Insufficient documentation

## 2013-04-25 DIAGNOSIS — R609 Edema, unspecified: Secondary | ICD-10-CM | POA: Insufficient documentation

## 2013-04-25 DIAGNOSIS — I10 Essential (primary) hypertension: Secondary | ICD-10-CM | POA: Insufficient documentation

## 2013-04-25 DIAGNOSIS — Z139 Encounter for screening, unspecified: Secondary | ICD-10-CM

## 2013-04-25 DIAGNOSIS — F0390 Unspecified dementia without behavioral disturbance: Secondary | ICD-10-CM | POA: Insufficient documentation

## 2013-04-25 DIAGNOSIS — Z8669 Personal history of other diseases of the nervous system and sense organs: Secondary | ICD-10-CM | POA: Insufficient documentation

## 2013-04-25 DIAGNOSIS — Z66 Do not resuscitate: Secondary | ICD-10-CM | POA: Insufficient documentation

## 2013-04-25 DIAGNOSIS — E039 Hypothyroidism, unspecified: Secondary | ICD-10-CM | POA: Insufficient documentation

## 2013-04-25 NOTE — ED Notes (Signed)
Dr. Oletta Lamas spoke with child of patient to learn baseline information. Pt will be discharged back to [facility].

## 2013-04-25 NOTE — ED Notes (Signed)
Per [facility], pt from [facility] for drooling noticed at 0600 while in her wheelchair. Per EMS, no deficits, leaning to the left because she is sleeping and when they try to straighten her up in the wheelchair she pushes back against them. VSS, CBG 88. Per EMS, when you move her she opens her eyes and looks around.

## 2013-04-25 NOTE — ED Notes (Signed)
[facility] contacted for transport back to [facility].

## 2013-04-25 NOTE — ED Provider Notes (Signed)
CSN: [id number]     Arrival date & time 04/25/13  0705 History   First MD Initiated Contact with Patient 04/25/13 [phone]     Chief Complaint  Patient presents with  . Drooling   (Consider location/radiation/quality/duration/timing/severity/associated sxs/prior Treatment) HPI Comments: Pt is brought by EMS from the facility, cared house after they discovered her sitting in her wheelchair slumped to the side and drooling. Per EMS, there was concern that the patient perhaps may have had a stroke. EMS reports that when they tried to awaken her and straighten her, they did not notice a facial droop and the patient with some purposeful movements tried to resist them. Patient is accompanied by both a DO NOT RESUSCITATE form as well as a MOST form dated April 2012 which indicates comfort measures only and preference was not to be transported to the hospital unless comfort measures could not be met.  Do to finding this paperwork, I contacted the patient's emergency contacts, reached Mr. Kaitlynne Wenz, the patient's son who indicated that he did not wish the patient to have any further evaluation and was okay to be sent back to the [facility]  The history is provided by medical records, a relative, the EMS personnel and the nursing home.    Past Medical History  Diagnosis Date  . Hypertension   . Hypothyroidism   . Organic brain syndrome   . Chronic airway obstruction     Emphysema and fibrosis on CXR  . Insomnia   . Heart failure   . Edema    History reviewed. No pertinent past surgical history. History reviewed. No pertinent family history. History  Substance Use Topics  . Smoking status: Never Smoker   . Smokeless tobacco: Not on file  . Alcohol Use: No   OB History   Grav Para Term Preterm Abortions TAB SAB Ect Mult Living                 Review of Systems  Unable to perform ROS: Dementia    Allergies  Review of patient's allergies indicates no known allergies.  Home Medications    Current Outpatient Rx  Name  Route  Sig  Dispense  Refill  . calcium-vitamin D (OSCAL WITH D) 500-200 MG-UNIT per tablet   Oral   Take 1 tablet by mouth daily.         Marland Kitchen docusate (COLACE) 50 MG/5ML liquid   Oral   Take 50 mg by mouth daily.         Marland Kitchen ENSURE PLUS (ENSURE PLUS) LIQD   Oral   Take 237 mLs by mouth 3 (three) times daily. Between meals and at night         . furosemide (LASIX) 20 MG tablet   Oral   Take 10 mg by mouth daily. Takes 0.5 tablet         . levothyroxine (SYNTHROID, LEVOTHROID) 50 MCG tablet   Oral   Take 50 mcg by mouth daily.         Marland Kitchen liver oil-zinc oxide (DESITIN) 40 % ointment   Topical   Apply 1 application topically 2 (two) times daily. To reddened perirectal area         . miconazole (MICOTIN) 2 % cream   Topical   Apply 1 application topically 3 (three) times daily as needed (skin irritation on bottom).         . mineral oil external liquid   Otic   Place 2 drops in ear(s)  once a week. On Monday         . Multiple Vitamins-Minerals (CERTAVITE SENIOR/ANTIOXIDANT PO)   Oral   Take 1 tablet by mouth daily.         . polyethylene glycol (MIRALAX / GLYCOLAX) packet   Oral   Take 17 g by mouth daily.         . Skin Protectants, Misc. (EUCERIN) cream   Topical   Apply 1 application topically 2 (two) times daily. To heels         . triamcinolone ointment (KENALOG) 0.1 %   Topical   Apply 1 application topically 2 (two) times daily. Apply to reddened area on right cheek until healed         . acetaminophen (TYLENOL) 500 MG tablet   Oral   Take 1,000 mg by mouth every 8 (eight) hours as needed. For pain         . magnesium hydroxide (MILK OF MAGNESIA) 400 MG/5ML suspension   Oral   Take 30 mLs by mouth daily as needed. For constipation         . polyvinyl alcohol (LIQUIFILM TEARS) 1.4 % ophthalmic solution   Both Eyes   Place 2 drops into both eyes every 4 (four) hours as needed. For dry eyes           BP 116/52  Temp(Src) 97.4 F (36.3 C) (Rectal)  Resp 18  SpO2 93% Physical Exam  Nursing note and vitals reviewed. Constitutional: She appears well-developed and well-nourished. She is sleeping and uncooperative.  Non-toxic appearance. She does not have a sickly appearance. She does not appear ill. No distress.  HENT:  Head: Normocephalic and atraumatic.  Cardiovascular: Normal rate and regular rhythm.   Pulmonary/Chest: Effort normal. She has no wheezes.  Abdominal: Soft. She exhibits no distension. There is no tenderness.  Skin: Skin is warm. She is not diaphoretic.    ED Course  Procedures (including critical care time) Labs Review Labs Reviewed - No data to display Imaging Review No results found.  EKG Interpretation   None       MDM   1. Screening    Pt appears to be sleeping, will arouse some when trying to straighten her in bed, will reach out fo rail without opening eyes.  Does not appear to be in pain, no grimacing.  Appears to be comfortable.  Will respect family wishes as indicated on face sheet and MOST form and pt is stable to be sent back to [facility].      Gavin Pound. Oletta Lamas, MD 04/25/13 [phone]

## 2013-04-25 NOTE — ED Notes (Signed)
[facility] called to transport patient back to [facility]. [facility] called and notified.

## 2013-10-21 ENCOUNTER — Ambulatory Visit (INDEPENDENT_AMBULATORY_CARE_PROVIDER_SITE_OTHER): Payer: Medicare Other | Admitting: Family Medicine

## 2013-10-21 VITALS — BP 130/70 | HR 95 | Temp 97.4°F | Resp 16 | Wt 114.0 lb

## 2013-10-21 DIAGNOSIS — L519 Erythema multiforme, unspecified: Secondary | ICD-10-CM

## 2013-10-21 NOTE — Progress Notes (Signed)
[facility] [address], [place] [address] [phone]  Date:  10/21/2013   Name:  Jo Combs   DOB:  [birth year]   MRN:  [id number]  PCP:  Reymundo Poll, MD    Chief Complaint: Rash   History of Present Illness:  Jo Combs is a 80 y.o. very pleasant female patient who presents with the following:  She is here today with her caregiver ([facility]) with a rash.  It started on her arms, then went to her inner thighs and back.  It has been there for aboutr one week. It does not seem to cause her distress but she is cognitively impaired.  They want to be sure it is not contagious. She is in memory care.  Otherwise she seems her usual self, no fever noted, she is still taking PO as usual.    Patient Active Problem List   Diagnosis Date Noted  . UTI (lower urinary tract infection) 10/12/2011  . Leukocytosis 10/12/2011  . Nausea and vomiting 10/12/2011  . Acute renal insufficiency 10/12/2011  . Encephalopathy 10/12/2011  . Hypertension   . Hypothyroidism     Past Medical History  Diagnosis Date  . Hypertension   . Hypothyroidism   . Organic brain syndrome   . Chronic airway obstruction     Emphysema and fibrosis on CXR  . Insomnia   . Heart failure   . Edema     History reviewed. No pertinent past surgical history.  History  Substance Use Topics  . Smoking status: Never Smoker   . Smokeless tobacco: Not on file  . Alcohol Use: No    History reviewed. No pertinent family history.  No Known Allergies  Medication list has been reviewed and updated.  Current Outpatient Prescriptions on File Prior to Visit  Medication Sig Dispense Refill  . acetaminophen (TYLENOL) 500 MG tablet Take 1,000 mg by mouth every 8 (eight) hours as needed. For pain      . calcium-vitamin D (OSCAL WITH D) 500-200 MG-UNIT per tablet Take 1 tablet by mouth daily.      Marland Kitchen docusate (COLACE) 50 MG/5ML liquid Take 50 mg by mouth daily.      Marland Kitchen ENSURE PLUS (ENSURE PLUS)  LIQD Take 237 mLs by mouth 3 (three) times daily. Between meals and at night      . furosemide (LASIX) 20 MG tablet Take 10 mg by mouth daily. Takes 0.5 tablet      . levothyroxine (SYNTHROID, LEVOTHROID) 50 MCG tablet Take 50 mcg by mouth daily.      Marland Kitchen liver oil-zinc oxide (DESITIN) 40 % ointment Apply 1 application topically 2 (two) times daily. To reddened perirectal area      . magnesium hydroxide (MILK OF MAGNESIA) 400 MG/5ML suspension Take 30 mLs by mouth daily as needed. For constipation      . polyethylene glycol (MIRALAX / GLYCOLAX) packet Take 17 g by mouth daily.      . Skin Protectants, Misc. (EUCERIN) cream Apply 1 application topically 2 (two) times daily. To heels      . miconazole (MICOTIN) 2 % cream Apply 1 application topically 3 (three) times daily as needed (skin irritation on bottom).      . mineral oil external liquid Place 2 drops in ear(s) once a week. On Monday      . Multiple Vitamins-Minerals (CERTAVITE SENIOR/ANTIOXIDANT PO) Take 1 tablet by mouth daily.      . polyvinyl alcohol (LIQUIFILM TEARS) 1.4 %  ophthalmic solution Place 2 drops into both eyes every 4 (four) hours as needed. For dry eyes      . triamcinolone ointment (KENALOG) 0.1 % Apply 1 application topically 2 (two) times daily. Apply to reddened area on right cheek until healed       No current facility-administered medications on file prior to visit.    Review of Systems:  As per HPI- otherwise negative. History through her Transport planner.   No new meds or foods as far as they are aware.  No cold sores.   Physical Examination: Filed Vitals:   10/21/13 1143  Pulse: 95  Temp: 97.4 F (36.3 C)  Resp: 16   Filed Vitals:   10/21/13 1143  Weight: 114 lb (51.71 kg)   There is no height on file to calculate BMI. Ideal Body Weight:    GEN: WDWN, NAD, Non-toxic,non- verbal.  Sitting in wheelchair with aid worker at her side.   HEENT: Atraumatic, Normocephalic. Neck supple. No masses, No LAD.  Poor  dentition but no lesions of the lips or oral mucosa.  No apparent HSV lesion Ears and Nose: No external deformity. CV: RRR, No M/G/R. No JVD. No thrill. No extra heart sounds. PULM: CTA B, no wheezes, crackles, rhonchi. No retractions. No resp. distress. No accessory muscle use. EXTR: No c/c/e NEURO Normal gait.  PSYCH: dementia.  Not able to verbally respond.  Not combative but not fully able to cooperated with exam  She has target lesions on her arms typical of erythema multiforme.  Few scattered lesions on her lower back and inner thighs as well.     Assessment and Plan: Erythema multiforme  Discussed with [facility] employee.  This is generally a self- limited problem that does not require any particular treatment.  However if Kenyanna develops any oral lesions or has a hard time eating/ drinking they will let us know.  If any other complications they will let us know  Signed Lamar Blinks, MD

## 2013-10-21 NOTE — Patient Instructions (Signed)
[name] appears to have erythema multiforme- this a self- limited and non- contagious rash.  It is sometiems caused by an infection of some sort.  As long as she seems comfortable and does not have oral lesions that cause eating or drinking problems there is nothing that needs to be done at this time.  Please call me if you have any concerns.   Erythema Multiforme Erythema multiforme (EM) is a rash that occurs mostly on the skin. Sometimes it occurs on the lips and mouth. It is usually a mild illness that goes away on its own. It usually affects young adults in the spring and fall. It tends to be recurrent with each episode lasting 1 to 4 weeks. CAUSES  The cause of EM may be an overreaction by the body's immune system to a trigger (something that causes the body to react).  Common triggers include:  Infections, including:  Viruses.  Bacteria.  Fungi.  Parasites.  Medicines. Less common triggers include:  Foods.  Chemicals.  Injuries to the skin.  Pregnancy.  Other illnesses. In some cases the cause may not be known. SYMPTOMS  The rash from EM shows up suddenly. The rash may appear days after the trigger. It may start as small, red, round or oval marks that become bumps or raised welts over 24 to 48 hours. These can spread and be quite large (about one inch [several centimeters]). These skin changes usually appear first on the backs of the hands, then spread to the tops of the feet, arms, elbows, knees, palms and soles. There may be a mild rash on the lips and lining of the mouth. The skin rash may show up in waves over a few days. There may be mild itching or burning of the skin at first. It may take up to 4 weeks to go away. The rash may come back again at a later time. DIAGNOSIS  Diagnosis of EM is usually made by physical exam. Sometimes a skin biopsy is done if the diagnosis is not certain. A skin biopsy is the removal of a small piece of tissue which can be examined under a  microscope by a specialist (pathologist). TREATMENT  Most episodes of EM heal on their own and treatment may not be needed. If possible, it is best to remove the trigger or treat the infection. If your trigger is a herpes virus infection (cold sore), use sunscreen lotion and sunscreen-containing lip balm to prevent sunlight triggered outbreaks of herpes virus. Medicine for itching may be given. Medicines can be used for severe cases and to prevent repeat bouts of EM.  HOME CARE INSTRUCTIONS   If possible, avoid known triggers.  If a medicine was your trigger, be sure to notify all of your caregivers. You should avoid this medicine or any like it in the future. SEEK MEDICAL CARE IF:   Your EM rash shows up again in the future SEEK IMMEDIATE MEDICAL CARE IF:   Red, swollen lips or mouth develop.  Burning feeling in the mouth or lips.  Blisters or open sores in the mouth, lips, vagina, penis or anus.  Eye pain, redness or drainage.  Blisters on the skin.  Difficulty breathing.  Difficulty swallowing; drooling.  Blood in urine.  Pain with urinating. Document Released: 07/01/2005 Document Revised: 09/23/2011 Document Reviewed: 06/17/2008 [facility] Patient Information 2014 [place], [place].

## 2013-11-12 DEATH — deceased

## 2014-02-19 IMAGING — CR DG CHEST 1V PORT
1 series · 1 of 1 positions shown · non-contrast
Comparison: None.

CLINICAL DATA: Vomiting and shortness of breath.  Unresponsive
patient.

PORTABLE CHEST - 1 VIEW

[AP]
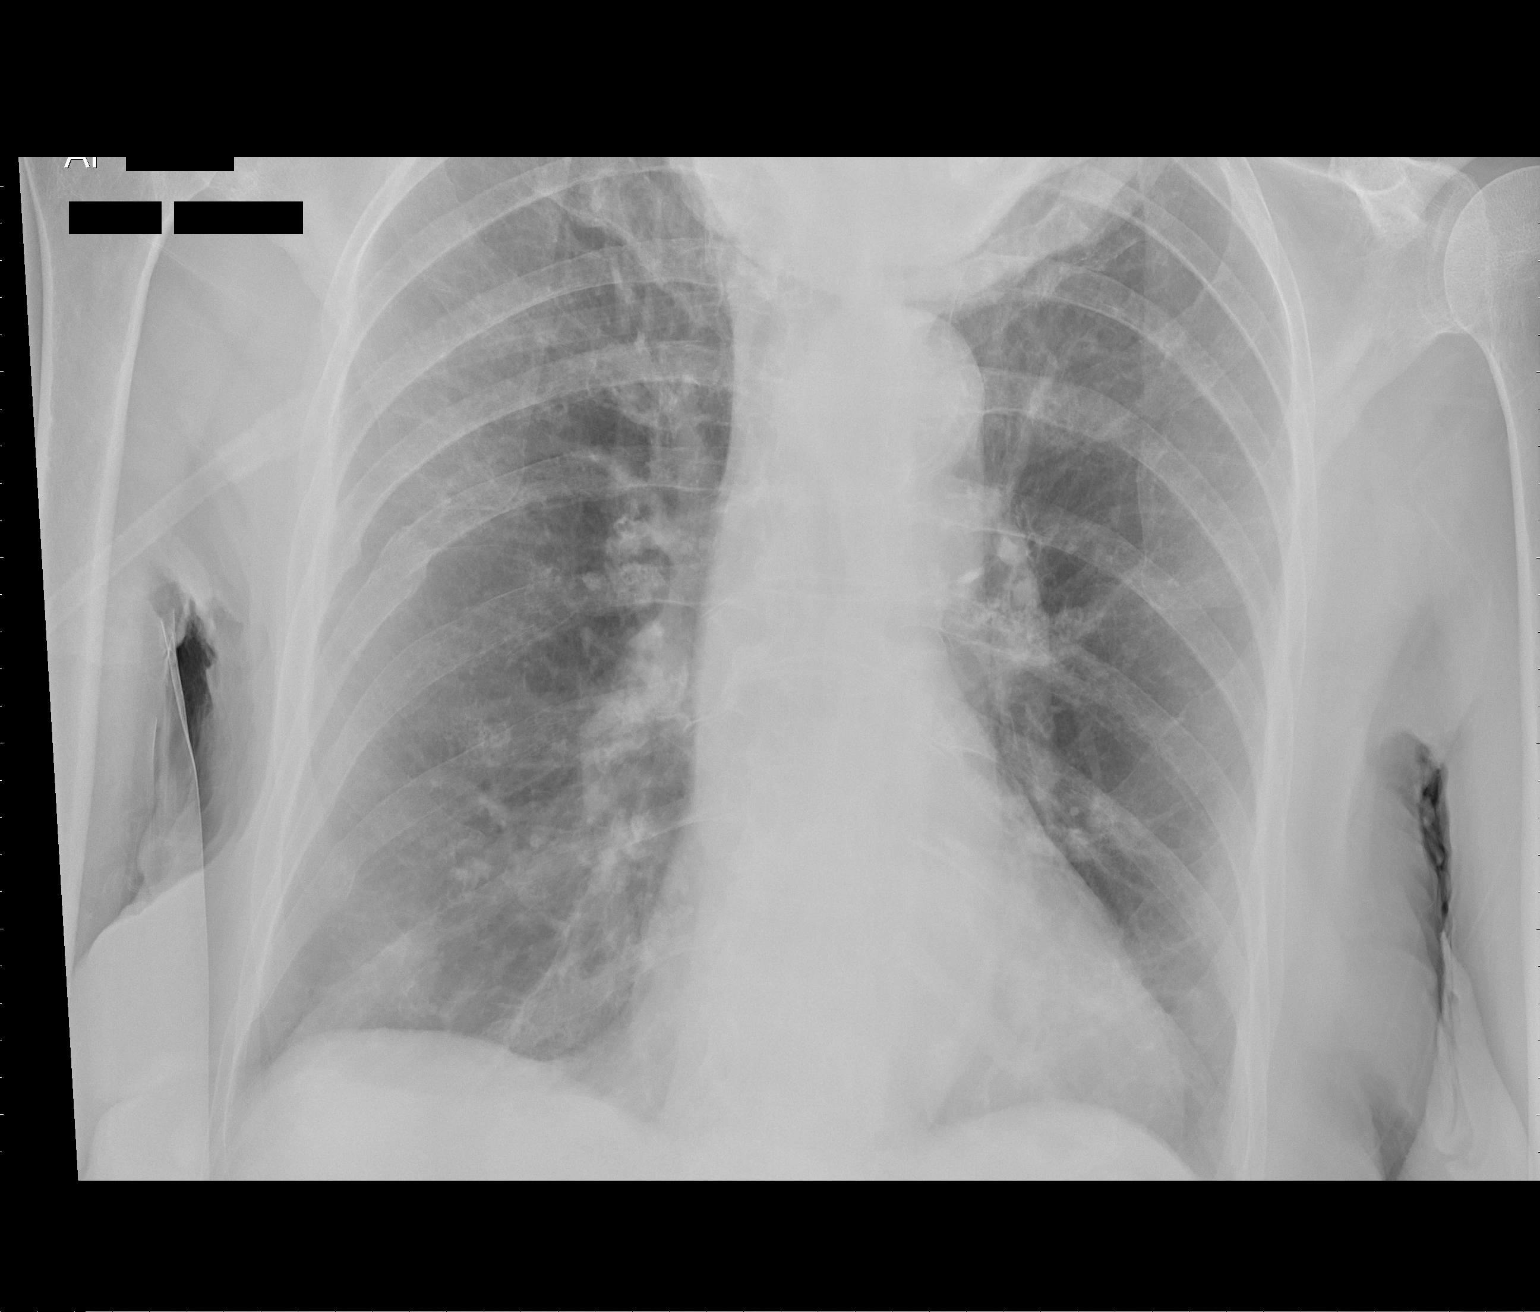

[1 of 1 positions shown; findings below may reference images not displayed]

FINDINGS: Borderline heart size and pulmonary vascularity.
Emphysematous changes in the lungs with scattered fibrosis.  No
focal airspace consolidation.  No blunting of costophrenic angles.
No obvious pneumothorax although portions of the lung apices are
excluded from the field of view.  Old right rib fractures
demonstrated healing.  Calcified and tortuous aorta.
IMPRESSION: Emphysematous changes and scattered fibrosis in the lungs.  No
evidence of active pulmonary disease.

## 2014-02-23 IMAGING — CR DG CHEST 2V
2 series · 2 of 2 positions shown · non-contrast
Comparison: 10/11/2011

CLINICAL DATA: Coarse breath sounds. Recent vomiting.

CHEST - 2 VIEW

[w chest lat]
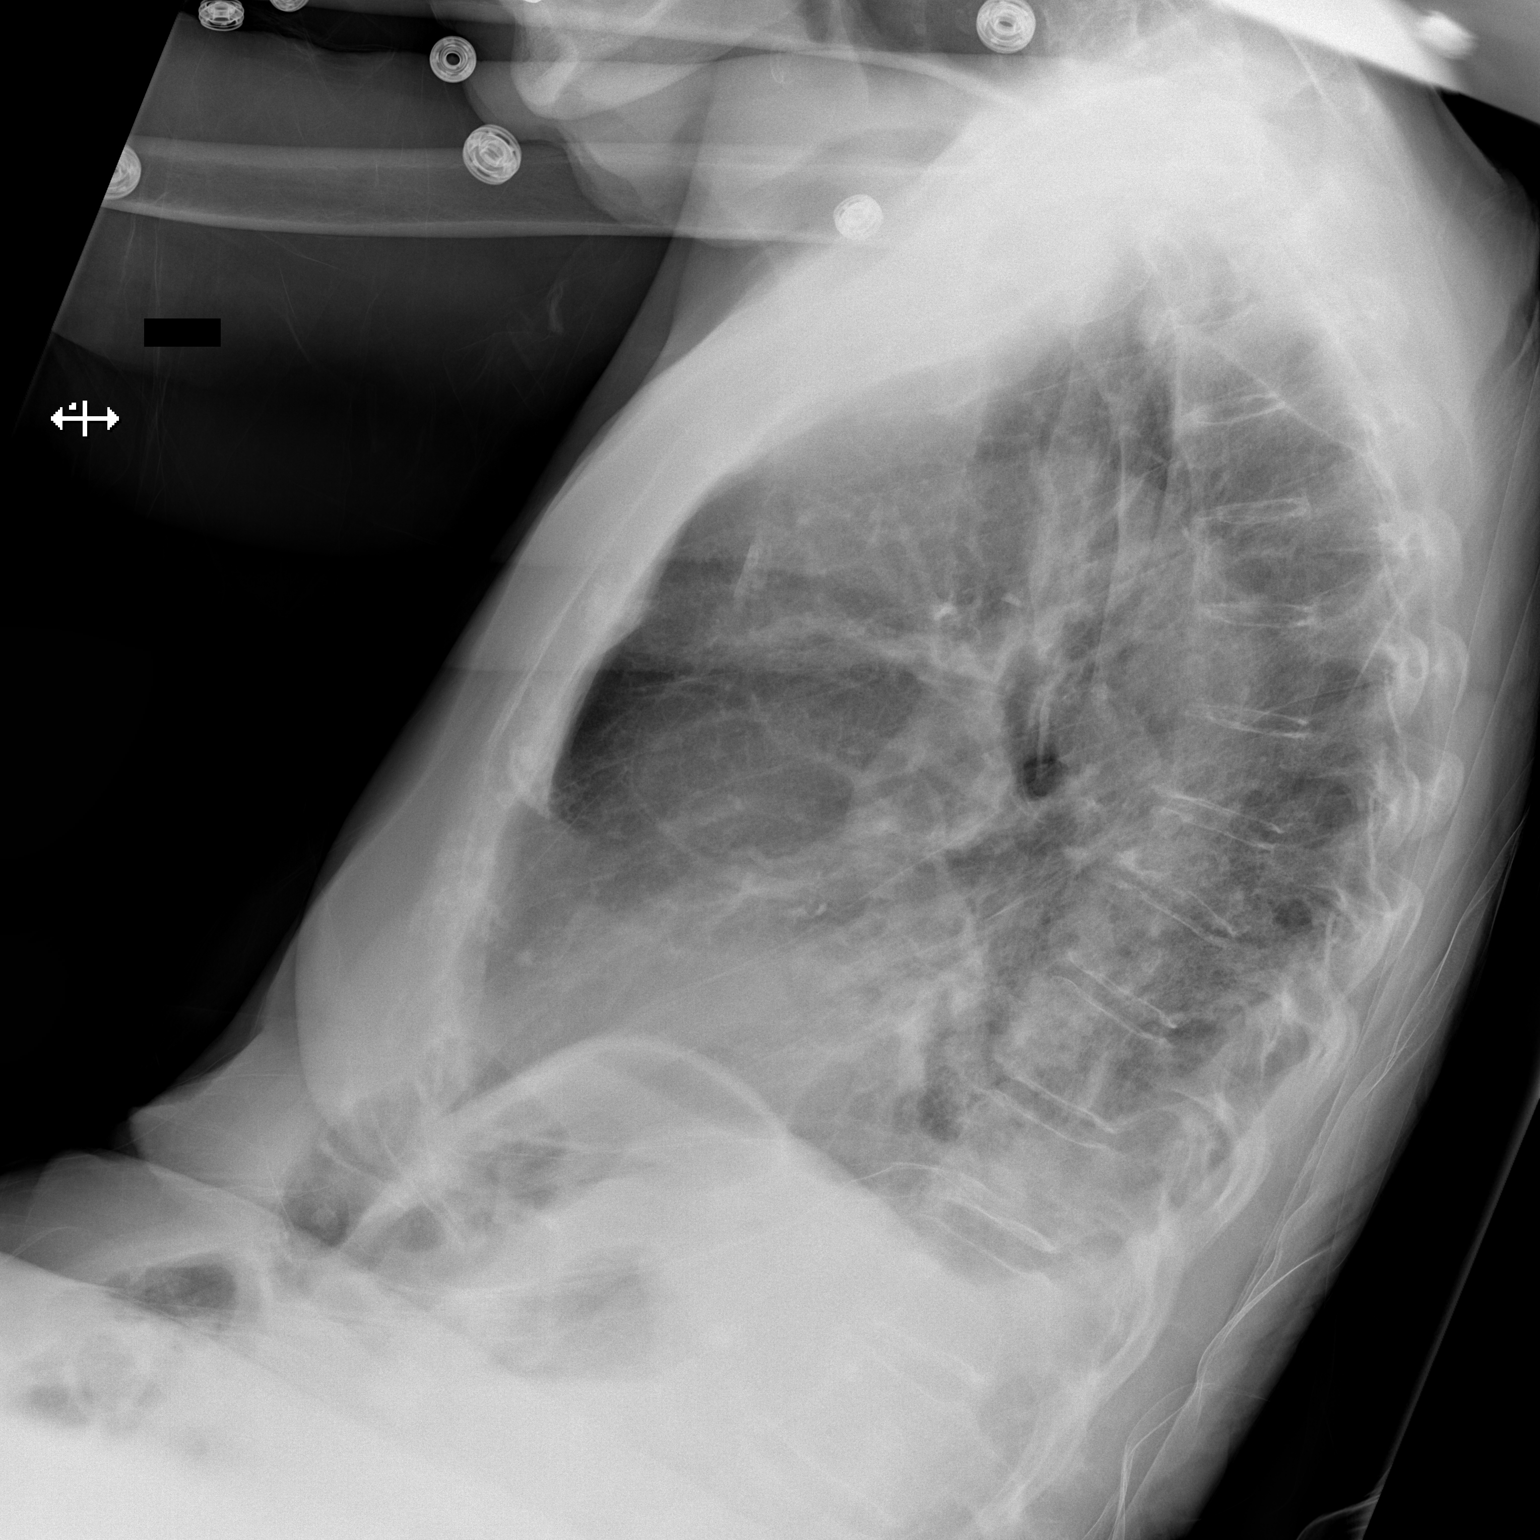

[x chest ap]
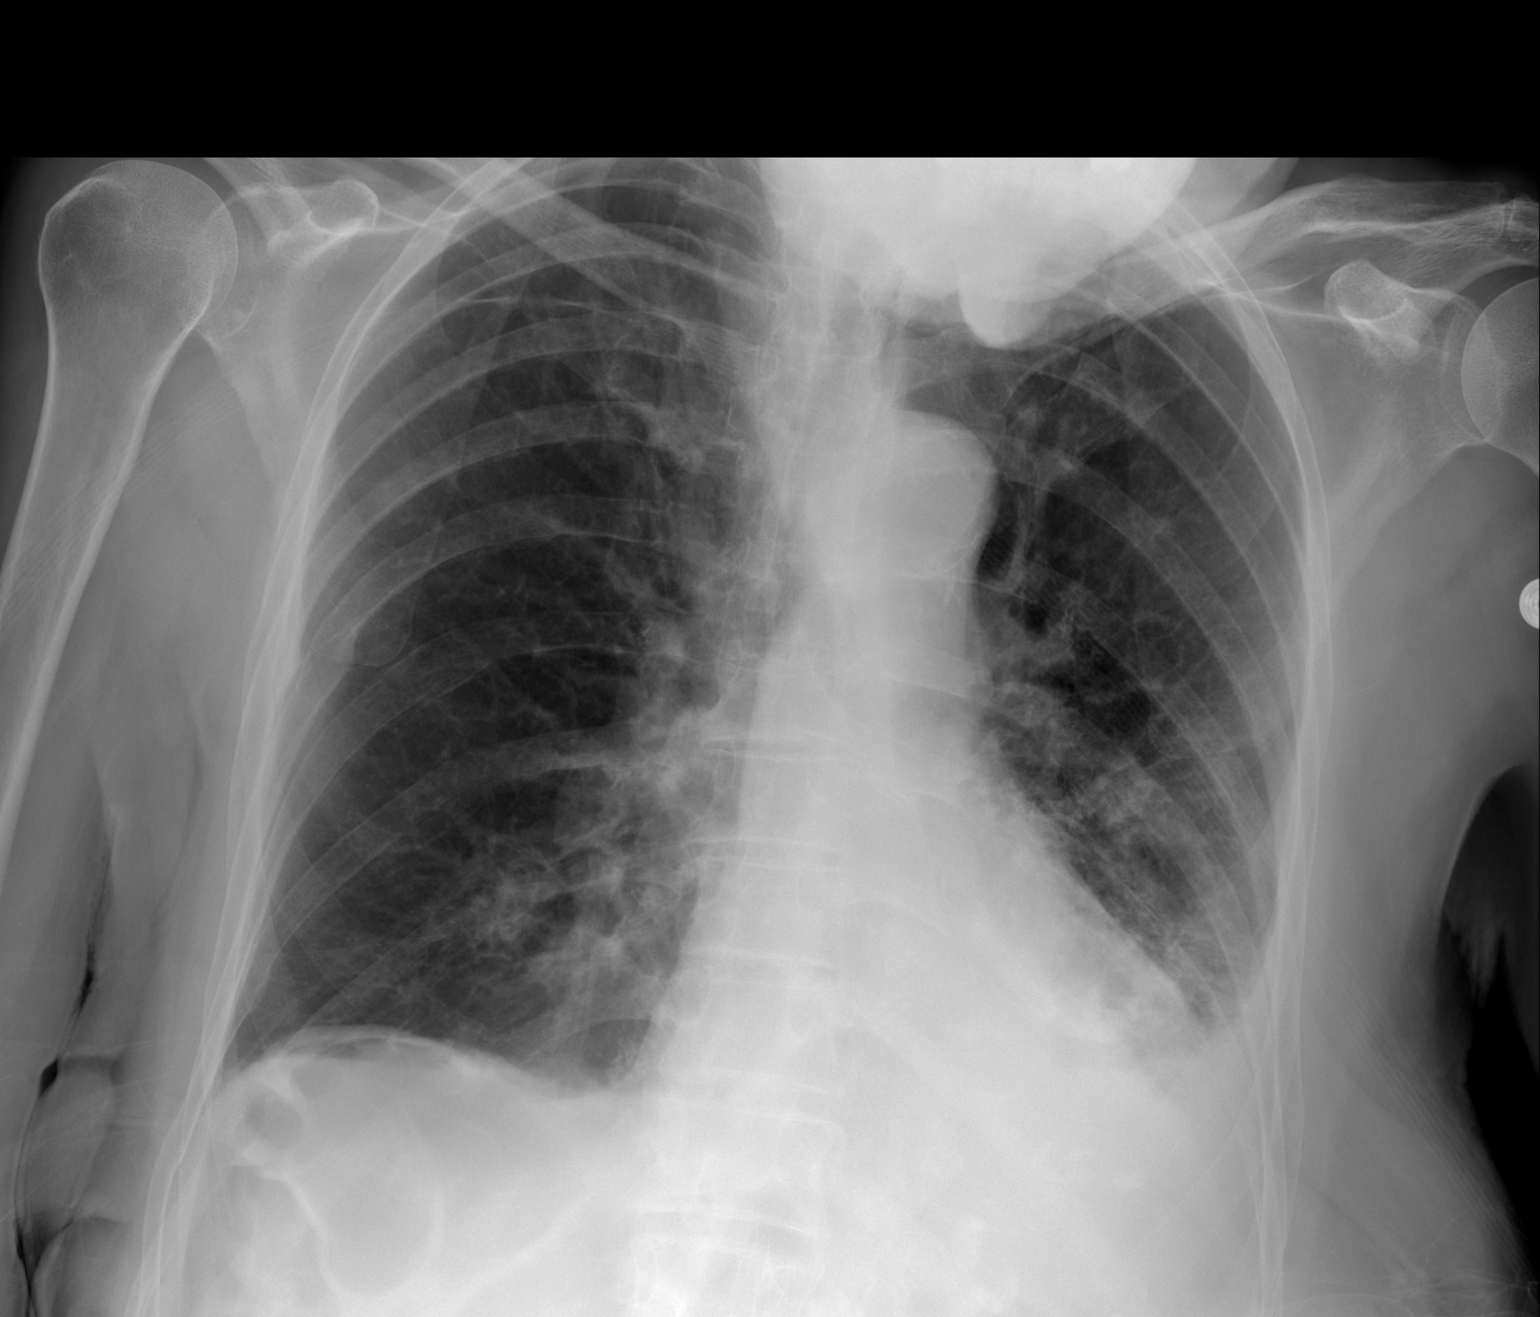

[2 of 2 positions shown; findings below may reference images not displayed]

FINDINGS: Since the prior study the patient has developed an
infiltrate and a small effusion at the left lung base.

The heart size and vascularity are normal.  Right lung is clear.
No acute osseous abnormality.
IMPRESSION: New left lower lobe infiltrate and small effusion.
